# Patient Record
Sex: Female | Born: 1960 | Race: Black or African American | Hispanic: No | Marital: Married | Smoking: Never smoker
Health system: Southern US, Community
[De-identification: ages and names within clinical notes are randomized; demographics above are authoritative.]

## PROBLEM LIST (undated history)

## (undated) DIAGNOSIS — J45909 Unspecified asthma, uncomplicated: Secondary | ICD-10-CM

## (undated) DIAGNOSIS — J309 Allergic rhinitis, unspecified: Secondary | ICD-10-CM

## (undated) DIAGNOSIS — G43909 Migraine, unspecified, not intractable, without status migrainosus: Secondary | ICD-10-CM

## (undated) HISTORY — DX: Unspecified asthma, uncomplicated: J45.909

## (undated) HISTORY — DX: Allergic rhinitis, unspecified: J30.9

---

## 1997-10-17 ENCOUNTER — Encounter: Admission: RE | Admit: 1997-10-17 | Discharge: 1997-10-17 | Payer: Self-pay | Admitting: *Deleted

## 2010-03-23 HISTORY — PX: SHOULDER ARTHROSCOPY W/ ROTATOR CUFF REPAIR: SHX2400

## 2010-09-23 ENCOUNTER — Ambulatory Visit
Admission: RE | Admit: 2010-09-23 | Discharge: 2010-09-23 | Disposition: A | Discharge status: Home / Self Care | Payer: BC Managed Care – PPO | Source: Ambulatory Visit | Attending: Orthopedic Surgery | Admitting: Orthopedic Surgery

## 2010-09-23 ENCOUNTER — Other Ambulatory Visit: Payer: Self-pay | Admitting: Orthopedic Surgery

## 2010-09-23 ENCOUNTER — Encounter (HOSPITAL_BASED_OUTPATIENT_CLINIC_OR_DEPARTMENT_OTHER)
Admission: RE | Admit: 2010-09-23 | Discharge: 2010-09-23 | Disposition: A | Discharge status: Home / Self Care | Payer: BC Managed Care – PPO | Source: Ambulatory Visit | Attending: Orthopedic Surgery | Admitting: Orthopedic Surgery

## 2010-09-23 DIAGNOSIS — Z01811 Encounter for preprocedural respiratory examination: Secondary | ICD-10-CM

## 2010-09-25 ENCOUNTER — Ambulatory Visit (HOSPITAL_BASED_OUTPATIENT_CLINIC_OR_DEPARTMENT_OTHER)
Admission: RE | Admit: 2010-09-25 | Discharge: 2010-09-25 | Disposition: A | Discharge status: Home / Self Care | Payer: BC Managed Care – PPO | Source: Ambulatory Visit | Attending: Orthopedic Surgery | Admitting: Orthopedic Surgery

## 2010-09-25 DIAGNOSIS — M19019 Primary osteoarthritis, unspecified shoulder: Secondary | ICD-10-CM | POA: Insufficient documentation

## 2010-09-25 DIAGNOSIS — Z01812 Encounter for preprocedural laboratory examination: Secondary | ICD-10-CM | POA: Insufficient documentation

## 2010-09-25 DIAGNOSIS — Z01818 Encounter for other preprocedural examination: Secondary | ICD-10-CM | POA: Insufficient documentation

## 2010-09-25 DIAGNOSIS — F4541 Pain disorder exclusively related to psychological factors: Secondary | ICD-10-CM | POA: Insufficient documentation

## 2010-09-25 DIAGNOSIS — M25819 Other specified joint disorders, unspecified shoulder: Secondary | ICD-10-CM | POA: Insufficient documentation

## 2010-09-25 DIAGNOSIS — Z01810 Encounter for preprocedural cardiovascular examination: Secondary | ICD-10-CM | POA: Insufficient documentation

## 2010-10-14 NOTE — Op Note (Signed)
NAME:  Joyce Fox, Joyce Fox NO.:  [id number]  MEDICAL RECORD NO.:  [id number]  LOCATION:                                 FACILITY:  PHYSICIAN:  Jones Broom, MD         DATE OF BIRTH:  DATE OF PROCEDURE:  09/25/2010 DATE OF DISCHARGE:                              OPERATIVE REPORT   PREOPERATIVE DIAGNOSES: 1. Right shoulder impingement syndrome. 2. 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  POSTOPERATIVE DIAGNOSES: 1. Right shoulder impingement syndrome. 2. 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  PROCEDURE PERFORMED: 1. Right shoulder acromioplasty. 2. Right shoulder distal clavicle excision.  ATTENDING SURGEON:  Jones Broom, MD  ASSISTANT:  None.  ANESTHESIA:  GETA.  COMPLICATIONS:  None.  DRAINS:  None.  SPECIMENS:  None.  ESTIMATED BLOOD LOSS:  Minimal.  INDICATIONS FOR SURGERY:  The patient is a 50 year old female with long history of right shoulder pain.  She has had extensive nonoperative management for multiple injections therapy and medication and has gone on to fail conservative treatment.  She wished to go forward with arthroscopic treatment.  She had an MRI which revealed impingement and degenerative changes at the [facility] joint but no evidence of rotator cuff tearing.  When I first met her, she had a lot of neurologic symptoms as well.  This eventually resolved and she was left with shoulder pain and wanted to go ahead with surgery.  She understood risks, benefits, and alternatives to surgery including but not limited to risk of bleeding, infection, damage to neurovascular structures, risk of stiffness, and potential for incomplete pain relief.  She elected to go forward with surgery.  PROCEDURE:  The patient was identified in the preoperative holding area where I personally marked the operative site after verifying site, side, and procedure with the patient.  She had an interscalene  block given by the attending anesthesiologist and was then taken back to the operating room where general anesthesia was induced without complication.  She was placed in the beach-chair position with the opposite extremity and the head and neck carefully padded in position.  The right upper extremity was prepped and draped in a standard sterile fashion, and the patient did receive IV antibiotics prior to the procedure.  Standard posterior portal was established and the arthroscope was introduced into the joint.  An anterior portal was then established and needle localization above the subscapularis.  Diagnostic arthroscopy was then carried out, findings as described above.  The glenohumeral joint surfaces were pristine.  No loose bodies were noted.  Subscapularis and supraspinatus were intact.  The infraspinatus was intact on the articular surface. The biceps and long head biceps tendon were healthy appearing.  There was some mild fraying of the subscapularis anteriorly which was debrided.  The arthroscope was then introduced in the subacromial space and a lateral portal was established with needle localization.  She was noted to have extensive hypertrophied bursa.  This was excised using shaver from lateral portal.  The bursal surface of the rotator cuff was then carefully examined anterior and posterior.  There was no evidence for full-thickness tearing but there was some small fibrillation and partial tearing anteriorly.  This was debrided.  The remaining tendon was healthy appearing.  She was noted to have thickening of the coracoacromial ligament as well as a very large anterior acromial spur. The coracoacromial ligament was taken down with the ArthroCare and the spur was then taken down with a 4-mm bur from lateral to medial creating a perfectly flat undersurface of the anterior acromion.  The distal clavicle was then exposed arthroscopically using combination of shaver and  ArthroCare and the undersurface of the distal clavicle was taken back for about 8 mm from the lateral portal.  The anterior portal was then moved into the [facility] joint and bur was used from the anterior portal to resect the remaining portion of the distal clavicle for total resection of about 8 mm and a smooth even resection.  The excision was viewed from lateral and anterior portals and was found to be smooth and even.  The superior and posterior ligaments were kept intact.  Collene Mares was then run into the joint to remove any excess bone dust and the arthroscope was then removed from the joint.  Portals were closed with 3- 0 nylon in interrupted fashion, and sterile dressings were applied including 4x4s, ABDs, and tape.  The patient was allowed to awaken from anesthesia, transferred to stretcher, and taken to the recovery room in stable condition.  POSTOPERATIVE PLAN:  She will be discharged home today with her family. She will have oxycodone for pain control.     Jones Broom, MD     JC/MEDQ  D:  09/25/2010  T:  09/26/2010  Job:  [id number]  Electronically Signed by Jones Broom  on 10/14/2010 03:57:12 PM

## 2013-03-09 DIAGNOSIS — J45909 Unspecified asthma, uncomplicated: Secondary | ICD-10-CM | POA: Insufficient documentation

## 2013-03-09 DIAGNOSIS — R112 Nausea with vomiting, unspecified: Secondary | ICD-10-CM | POA: Insufficient documentation

## 2013-03-09 DIAGNOSIS — M5116 Intervertebral disc disorders with radiculopathy, lumbar region: Secondary | ICD-10-CM | POA: Insufficient documentation

## 2013-03-09 DIAGNOSIS — K219 Gastro-esophageal reflux disease without esophagitis: Secondary | ICD-10-CM | POA: Insufficient documentation

## 2013-03-09 DIAGNOSIS — I208 Other forms of angina pectoris: Secondary | ICD-10-CM | POA: Insufficient documentation

## 2013-03-09 DIAGNOSIS — G8929 Other chronic pain: Secondary | ICD-10-CM | POA: Insufficient documentation

## 2013-03-09 DIAGNOSIS — D649 Anemia, unspecified: Secondary | ICD-10-CM | POA: Insufficient documentation

## 2015-12-16 ENCOUNTER — Encounter: Payer: Self-pay | Admitting: Pediatrics

## 2015-12-16 ENCOUNTER — Ambulatory Visit (INDEPENDENT_AMBULATORY_CARE_PROVIDER_SITE_OTHER): Payer: Medicare HMO | Admitting: Pediatrics

## 2015-12-16 VITALS — BP 126/88 | HR 100 | Temp 98.3°F | Resp 20 | Ht 67.8 in | Wt 207.0 lb

## 2015-12-16 DIAGNOSIS — J4541 Moderate persistent asthma with (acute) exacerbation: Secondary | ICD-10-CM

## 2015-12-16 DIAGNOSIS — L503 Dermatographic urticaria: Secondary | ICD-10-CM | POA: Diagnosis not present

## 2015-12-16 DIAGNOSIS — J309 Allergic rhinitis, unspecified: Secondary | ICD-10-CM | POA: Diagnosis not present

## 2015-12-16 DIAGNOSIS — I208 Other forms of angina pectoris: Secondary | ICD-10-CM | POA: Diagnosis not present

## 2015-12-16 DIAGNOSIS — T63441A Toxic effect of venom of bees, accidental (unintentional), initial encounter: Secondary | ICD-10-CM | POA: Insufficient documentation

## 2015-12-16 DIAGNOSIS — T7800XD Anaphylactic reaction due to unspecified food, subsequent encounter: Secondary | ICD-10-CM | POA: Diagnosis not present

## 2015-12-16 DIAGNOSIS — J301 Allergic rhinitis due to pollen: Secondary | ICD-10-CM

## 2015-12-16 DIAGNOSIS — J3089 Other allergic rhinitis: Secondary | ICD-10-CM | POA: Insufficient documentation

## 2015-12-16 DIAGNOSIS — T7800XA Anaphylactic reaction due to unspecified food, initial encounter: Secondary | ICD-10-CM | POA: Insufficient documentation

## 2015-12-16 MED ORDER — CETIRIZINE HCL 10 MG PO TABS: 10.0000 mg | ORAL_TABLET | Freq: Every day | ORAL | 3 refills | Status: DC | PRN

## 2015-12-16 MED ORDER — EPINEPHRINE 0.3 MG/0.3ML IJ SOAJ: INTRAMUSCULAR | 2 refills | Status: DC

## 2015-12-16 MED ORDER — BUDESONIDE 180 MCG/ACT IN AEPB: INHALATION_SPRAY | RESPIRATORY_TRACT | 2 refills | Status: DC

## 2015-12-16 MED ORDER — FLUTICASONE PROPIONATE 50 MCG/ACT NA SUSP: NASAL | 3 refills | Status: AC

## 2015-12-16 NOTE — Progress Notes (Signed)
[address] [place] [address] Dept: [phone]  New Patient Note  Patient ID: Joyce PokeBetty Ann Fox, female    DOB: 1960/06/16  Age: 55 y.o. MRN: [id number] Date of Office Visit: 12/16/2015 Referring provider: No referring provider defined for this encounter.    Chief Complaint: Asthma; Wheezing (with exercise); and Cough  HPI Joyce Fox presents for evaluation of asthma, allergic rhinitis and food allergies. She has had asthma since childhood. She has been having coughing spells. The last time she received prednisone for asthma was in November 2016. She also has had nasal congestion for several years. She has aggravation of her symptoms on exposure to dust, cigarette smoke, ragweed, cat and  dog. She has had hives in the past from cats.. She has had hives from chocolate, pineapple, tomato, okra and aspirin and related compounds. She also has had hives from acetaminophen. At times her skin itches. She has never had eczema. A few years ago following an insect sting , she had very large local swelling and some shortness of breath.  Review of Systems  Constitutional: Negative.   HENT:       Nasal congestion for several years  Eyes: Negative.   Respiratory:       Asthma since childhood. Her last dose of prednisone was in November 2016  Cardiovascular:       Angina at times  Gastrointestinal:       Gastroesophageal reflux  Genitourinary:       Cryo therapy of her cervix at age 55. 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 Repair of the right rotator cuff but she cannot lift her arm well  Skin: Negative.   Neurological:       History of migraine headaches  Endo/Heme/Allergies:       No diabetes or thyroid disease  Psychiatric/Behavioral: Negative.     Outpatient Encounter Prescriptions as of 12/16/2015  Medication Sig  . albuterol (PROVENTIL HFA;VENTOLIN HFA) 108 (90 Base) MCG/ACT inhaler Inhale into the lungs.  Marland Kitchen. albuterol  (PROVENTIL) (2.5 MG/3ML) 0.083% nebulizer solution Take 2.5 mg by nebulization every 4 (four) hours as needed for wheezing or shortness of breath.  . benzonatate (TESSALON) 100 MG capsule Take 100 mg by mouth.  . budesonide (PULMICORT FLEXHALER) 180 MCG/ACT inhaler TWO PUFFS TWICE A DAY TO PREVENT COUGH OR WHEEZE. RINSE, GARGLE AND SPIT AFTER USE.  . cetirizine (ZYRTEC) 10 MG tablet Take 1 tablet (10 mg total) by mouth daily as needed for allergies or rhinitis.  . Cholecalciferol (VITAMIN D-1000 MAX ST) 1000 units tablet Take by mouth.  . cyclobenzaprine (FLEXERIL) 10 MG tablet Take 10 mg by mouth.  . diphenhydrAMINE (BENADRYL) 25 mg capsule Take 25 mg by mouth.  . docusate sodium (STOOL SOFTENER) 100 MG capsule Take 300 mg by mouth.  . EFFERVESCENT PAIN RELIEF [phone] MG TBEF tablet   . famotidine (PEPCID) 20 MG tablet Take 20 mg by mouth.  . ferrous sulfate 325 (65 FE) MG EC tablet Take 325 mg by mouth 3 (three) times daily with meals.  . hydrOXYzine (ATARAX/VISTARIL) 25 MG tablet Take 50 mg by mouth.  Marland Kitchen. LYRICA 75 MG capsule   . montelukast (SINGULAIR) 10 MG tablet Take 10 mg by mouth.  . nitroGLYCERIN (NITROSTAT) 0.4 MG SL tablet Place 0.4 mg under the tongue.  . pantoprazole (PROTONIX) 40 MG tablet Take 40 mg by mouth.  . traMADol (ULTRAM) 50 MG tablet Take 50 mg by mouth.  . [  DISCONTINUED] budesonide (PULMICORT FLEXHALER) 180 MCG/ACT inhaler Inhale 2 puffs into the lungs 2 (two) times daily.   . [DISCONTINUED] cetirizine (ZYRTEC) 10 MG tablet Take 10 mg by mouth.  . EPINEPHrine (EPIPEN 2-PAK) 0.3 mg/0.3 mL IJ SOAJ injection USE AS DIRECTED FOR SEVERE ALLERGIC REACTION.  . fluticasone (FLONASE) 50 MCG/ACT nasal spray TWO SPRAYS EACH NOSTRIL ONCE A DAY FOR NASAL CONGESTION OR DRAINAGE.  . [DISCONTINUED] cyclobenzaprine (FLEXERIL) 10 MG tablet   . [DISCONTINUED] famotidine (PEPCID) 20 MG tablet   . [DISCONTINUED] hydrOXYzine (ATARAX/VISTARIL) 25 MG tablet   . [DISCONTINUED]  montelukast (SINGULAIR) 10 MG tablet   . [DISCONTINUED] nitroGLYCERIN (NITROSTAT) 0.4 MG SL tablet   . [DISCONTINUED] pantoprazole (PROTONIX) 40 MG tablet   . [DISCONTINUED] RaNITidine HCl (RANITIDINE ACID REDUCER PO)   . [DISCONTINUED] traMADol (ULTRAM) 50 MG tablet    No facility-administered encounter medications on file as of 12/16/2015.      Drug Allergies:  Allergies  Allergen Reactions  . Acetaminophen Hives  . Aspirin Other (See Comments)    Per allergy test  . Chocolate Flavor Other (See Comments)    Rash in mouth  . Other Other (See Comments)    pripoxyphone  . Pineapple Other (See Comments)    Rash in mouth  . Prunus Persica Other (See Comments)    Rash in mouth  . Sulfamethoxazole Hives  . Tomato Other (See Comments)    Rash in mouth  . Bee Venom Rash and Hives  . Latex Rash and Hives  . Okra Rash  . Penicillins Rash    Family History: Josetta's family history includes Allergic rhinitis in her father and mother; Asthma in her father and mother; Heart attack in her brother and father... There is no family history of eczema, hives, food allergies, chronic bronchitis or emphysema  Social and environmental. There are no pets in the home. She is not exposed to cigarette smoking. She has never smoked cigarettes. She is disabled because of a damaged right rotator cuff  Physical Exam: BP 126/88 (BP Location: Left Arm, Patient Position: Sitting, Cuff Size: Large)   Pulse 100   Temp 98.3 F (36.8 C) (Oral)   Resp 20   Ht 5' 7.8" (1.722 m)   Wt 207 lb (93.9 kg)   SpO2 98%   BMI 31.66 kg/m    Physical Exam  Constitutional: She is oriented to person, place, and time. She appears well-developed and well-nourished.  HENT:  Eyes normal. Ears normal. Nose mild swelling of nasal turbinates. Pharynx normal.  Neck: Neck supple. No thyromegaly present.  Cardiovascular:  S1 and S2 normal no murmurs  Pulmonary/Chest:  Clear to percussion and auscultation  Abdominal: Soft.  There is no tenderness (no hepatosplenomegaly).  Lymphadenopathy:    She has no cervical adenopathy.  Neurological: She is alert and oriented to person, place, and time.  Skin:  Clear but she had dermographia noted  Psychiatric: She has a normal mood and affect. Her behavior is normal. Judgment and thought content normal.  Vitals reviewed.   Diagnostics: FVC 2.36 L FEV1 2.17 L. Predicted FVC 3.21 L predicted FEV1 2.54 L. After albuterol 2 puffs FVC 2.44 L FEV1 2.25 L-the spirometry shows a mild reduction in the forced vital capacity and there was no significant improvement after albuterol  Allergy skin tests were positive to grass pollen, weeds, tree pollens, dust mite, cat   Assessment  Assessment and Plan: 1. Anaphylactic reaction to bee sting, accidental or unintentional, initial encounter   2.  Asthma without status asthmaticus, moderate persistent, with acute exacerbation   3. Allergic rhinitis, unspecified allergic rhinitis type   4. Dermographia   5. Angina at rest [facility])   6. Allergy with anaphylaxis due to food, subsequent encounter   7. Allergic rhinitis due to pollen   8. Seasonal allergic rhinitis due to pollen     Meds ordered this encounter  Medications  . fluticasone (FLONASE) 50 MCG/ACT nasal spray    Sig: TWO SPRAYS EACH NOSTRIL ONCE A DAY FOR NASAL CONGESTION OR DRAINAGE.    Dispense:  48 g    Refill:  3  . budesonide (PULMICORT FLEXHALER) 180 MCG/ACT inhaler    Sig: TWO PUFFS TWICE A DAY TO PREVENT COUGH OR WHEEZE. RINSE, GARGLE AND SPIT AFTER USE.    Dispense:  3 each    Refill:  2  . EPINEPHrine (EPIPEN 2-PAK) 0.3 mg/0.3 mL IJ SOAJ injection    Sig: USE AS DIRECTED FOR SEVERE ALLERGIC REACTION.    Dispense:  3 Device    Refill:  2    DISPENSE MYLAN BRAND OR MYLAN GENERIC ONLY. 90  DAYS SUPPLY.  . cetirizine (ZYRTEC) 10 MG tablet    Sig: Take 1 tablet (10 mg total) by mouth daily as needed for allergies or rhinitis.    Dispense:  90 tablet    Refill:  3     Patient Instructions  Environmental control of dust mite Zyrtec 10 mg-one tablet once a day for runny nose Fluticasone 2 sprays per nostril once a day for stuffy nose Pulmicort 180 -2 puffs twice a day to prevent coughing or wheezing Proventil 2 puffs every 4 hours if needed for wheezing or coughing spells Albuterol 0.083% one unit dose every 4 hours if needed for coughing or wheezing Montelukast  10 mg once a day for coughing or wheezing Add prednisone 10 mg twice a day for 4 days, 10 mg on the fifth day Call me if you're not doing better on this treatment plan Do foods with salicylates make you itch? Since you  can eat boiled eggs, I feel that you can have a flu vaccination  Continue avoiding aspirin and related compounds, pineapple, tomato and okra. If you have an allergic reaction, including from an insect sting, take Benadryl 50 mg every 4 hours and if you have life-threatening symptoms inject  with EpiPen 0.3 mg  I will call you with the results of your  of blood test for allergy to insect stings   Return in about 6 weeks (around 01/27/2016).   Thank you for the opportunity to care for this patient.  Please do not hesitate to contact me with questions.  Tonette Bihari, M.D.  [facility] [address] [place], [place] [address] [phone]

## 2015-12-16 NOTE — Patient Instructions (Addendum)
Environmental control of dust mite Zyrtec 10 mg-one tablet once a day for runny nose Fluticasone 2 sprays per nostril once a day for stuffy nose Pulmicort 180 -2 puffs twice a day to prevent coughing or wheezing Proventil 2 puffs every 4 hours if needed for wheezing or coughing spells Albuterol 0.083% one unit dose every 4 hours if needed for coughing or wheezing Montelukast  10 mg once a day for coughing or wheezing Add prednisone 10 mg twice a day for 4 days, 10 mg on the fifth day Call me if you're not doing better on this treatment plan Do foods with salicylates make you itch? Since you  can eat boiled eggs, I feel that you can have a flu vaccination  Continue avoiding aspirin and related compounds, pineapple, tomato and okra. If you have an allergic reaction, including from an insect sting, take Benadryl 50 mg every 4 hours and if you have life-threatening symptoms inject  with EpiPen 0.3 mg  I will call you with the results of your  of blood test for allergy to insect stings

## 2015-12-27 ENCOUNTER — Telehealth: Payer: Self-pay | Admitting: Pediatrics

## 2015-12-27 NOTE — Telephone Encounter (Signed)
Patient stated that she needs a letter to give to the electric company from Dr.Bardelas saying that she has asthma and requires air conditioning. Please call her back soon as possible.

## 2015-12-31 NOTE — Telephone Encounter (Signed)
Please type a note that  Joyce Fox has severe asthma and needs air conditioning in the home

## 2015-12-31 NOTE — Telephone Encounter (Signed)
Left message that letter was ready. 

## 2016-01-27 ENCOUNTER — Encounter: Payer: Self-pay | Admitting: Pediatrics

## 2016-01-27 ENCOUNTER — Ambulatory Visit (INDEPENDENT_AMBULATORY_CARE_PROVIDER_SITE_OTHER): Payer: Medicare HMO | Admitting: Pediatrics

## 2016-01-27 VITALS — BP 136/88 | HR 98 | Temp 98.1°F | Resp 20

## 2016-01-27 DIAGNOSIS — J3089 Other allergic rhinitis: Secondary | ICD-10-CM | POA: Diagnosis not present

## 2016-01-27 DIAGNOSIS — I208 Other forms of angina pectoris: Secondary | ICD-10-CM

## 2016-01-27 DIAGNOSIS — J4540 Moderate persistent asthma, uncomplicated: Secondary | ICD-10-CM | POA: Diagnosis not present

## 2016-01-27 DIAGNOSIS — T63441A Toxic effect of venom of bees, accidental (unintentional), initial encounter: Secondary | ICD-10-CM | POA: Insufficient documentation

## 2016-01-27 DIAGNOSIS — T63441D Toxic effect of venom of bees, accidental (unintentional), subsequent encounter: Secondary | ICD-10-CM | POA: Diagnosis not present

## 2016-01-27 DIAGNOSIS — T7800XD Anaphylactic reaction due to unspecified food, subsequent encounter: Secondary | ICD-10-CM | POA: Diagnosis not present

## 2016-01-27 LAB — PULMONARY FUNCTION TEST

## 2016-01-27 MED ORDER — CETIRIZINE HCL 10 MG PO TABS: 10.0000 mg | ORAL_TABLET | Freq: Every day | ORAL | 3 refills | Status: AC | PRN

## 2016-01-27 MED ORDER — BUDESONIDE 180 MCG/ACT IN AEPB: INHALATION_SPRAY | RESPIRATORY_TRACT | 2 refills | Status: DC

## 2016-01-27 NOTE — Progress Notes (Signed)
[address] [place] [address] Dept: [phone]  FOLLOW UP NOTE  Patient ID: Joyce Fox, female    DOB: February 05, 1961  Age: 55 y.o. MRN: [id number] Date of Office Visit: 01/27/2016  Assessment  Chief Complaint: Asthma and Allergies  HPI Joyce PokeBetty Ann Fox presents for follow-up of asthma and allergic rhinitis. Her asthma has been well controlled .Marland Kitchen. Her nasal symptoms have been well controlled. She did not have her blood work for insect allergy because it would have cost her $45 and she will be going  to her clinic where it would not cost her a co-pay. She had mild delayed itching for a Fox days at the site of 3 intradermal skin tests  Current medications are Zyrtec 10 mg once a day if needed for runny nose, fluticasone 2 sprays per nostril once a day if needed for stuffy nose, Pulmicort 180--2 puffs twice a day to prevent coughing or wheezing, Proventil 2 puffs every 4 hours if needed for wheezing or coughing spells or instead albuterol 0.083% one unit dose every 4 hours if needed, montelukast  10 mg once, Benadryl and EpiPen if needed Her other medications are outlined in the chart     Drug Allergies:  Allergies  Allergen Reactions  . Acetaminophen Hives  . Aspirin Other (See Comments)    Per allergy test  . Chocolate Flavor Other (See Comments)    Rash in mouth  . Other Hives    Pain medication, pripoxyphone  . Pineapple Other (See Comments)    Rash in mouth  . Prunus Persica Other (See Comments)    Rash in mouth  . Sulfamethoxazole Hives  . Tomato Other (See Comments)    Rash in mouth  . Bee Venom Rash and Hives  . Latex Rash and Hives  . Okra Rash  . Penicillins Rash    Physical Exam: BP 136/88   Pulse 98   Temp 98.1 F (36.7 C) (Oral)   Resp 20   SpO2 95%    Physical Exam  Constitutional: She is oriented to person, place, and time. She appears well-developed and well-nourished.  HENT:  Eyes normal. Ears normal. Nose normal. Pharynx normal.    Neck: Neck supple.  Cardiovascular:  S1 and S2 normal no murmurs  Pulmonary/Chest:  Clear to percussion auscultation  Lymphadenopathy:    She has no cervical adenopathy.  Neurological: She is alert and oriented to person, place, and time.  Psychiatric: She has a normal mood and affect. Her behavior is normal. Judgment and thought content normal.  Vitals reviewed.   Diagnostics:  FVC 2.47 L FEV1 2.15 L. Predicted FVC 3.07 L predicted FEV1 2.43 L-the spirometry is in the normal range  Assessment and Plan: 1. Poisoning by bee sting, accidental or unintentional, subsequent encounter   2. Moderate persistent asthma without complication   3. Other allergic rhinitis   4. Anaphylactic shock due to food, subsequent encounter   5. Angina at rest [facility])     Meds ordered this encounter  Medications  . cetirizine (ZYRTEC) 10 MG tablet    Sig: Take 1 tablet (10 mg total) by mouth daily as needed for allergies or rhinitis.    Dispense:  90 tablet    Refill:  3  . budesonide (PULMICORT FLEXHALER) 180 MCG/ACT inhaler    Sig: TWO PUFFS TWICE A DAY TO PREVENT COUGH OR WHEEZE. RINSE, GARGLE AND SPIT AFTER USE.    Dispense:  3 each    Refill:  2  Patient Instructions  Continue on her current medications We will call you with the results of your blood work for insect allergy Continue avoiding aspirin and related compounds, pineapple, tomato and okra. If you have an allergic reaction , including from an insect sting, take Benadryl 50 mg every 4 hours and if you have  life-threatening symptoms inject  with EpiPen 0.3 mg.   Return in about 1 year (around 01/26/2017).    Thank you for the opportunity to care for this patient.  Please do not hesitate to contact me with questions.  Tonette BihariJ. A. Bardelas, M.D.  [facility] [address] [place], [place] [address] [phone]

## 2016-01-27 NOTE — Patient Instructions (Signed)
Continue on her current medications We will call you with the results of your blood work for insect allergy Continue avoiding aspirin and related compounds, pineapple, tomato and okra. If you have an allergic reaction , including from an insect sting, take Benadryl 50 mg every 4 hours and if you have  life-threatening symptoms inject  with EpiPen 0.3 mg.

## 2016-01-28 ENCOUNTER — Encounter: Payer: Self-pay | Admitting: Pediatrics

## 2018-07-29 ENCOUNTER — Telehealth: Payer: Self-pay | Admitting: Allergy

## 2018-07-29 ENCOUNTER — Other Ambulatory Visit: Payer: Self-pay | Admitting: Allergy

## 2018-07-29 MED ORDER — PREDNISONE 20 MG PO TABS: 20.0000 mg | ORAL_TABLET | Freq: Two times a day (BID) | ORAL | 0 refills | Status: AC

## 2018-07-29 NOTE — Telephone Encounter (Signed)
See orders only note from 07/29/18

## 2018-07-29 NOTE — Progress Notes (Signed)
Called by patient and she states she has been having increase in coughing and mucus production of needing to use her albuterol every 2 hours since Tuesday of this week.  She states she rolled in her car with the windows down on Monday and feels that may have been her trigger.  She states she is taking Pulmicort 2 puffs twice a day and Singulair daily and Zyrtec daily.  She states she has been getting her refills from her primary care's office.  She has not been seen in our office since November 2017 by Joyce Fox.  Advised that she likely was having an asthma flare at this point and will send in prednisone 20 mg twice a day x5 days and advised that she continue her regular medications. Advised that our office will call her on Monday to get an update and to have her schedule a follow-up visit with Joyce Fox.

## 2018-08-01 NOTE — Telephone Encounter (Signed)
Pts cough is doing a little better, telemed phone visit scheduled tomorrow with Dr. Beaulah Dinning.

## 2018-08-02 ENCOUNTER — Encounter: Payer: Self-pay | Admitting: Pediatrics

## 2018-08-02 ENCOUNTER — Ambulatory Visit (INDEPENDENT_AMBULATORY_CARE_PROVIDER_SITE_OTHER): Payer: Medicare HMO | Admitting: Pediatrics

## 2018-08-02 ENCOUNTER — Other Ambulatory Visit: Payer: Self-pay

## 2018-08-02 DIAGNOSIS — T7800XD Anaphylactic reaction due to unspecified food, subsequent encounter: Secondary | ICD-10-CM

## 2018-08-02 DIAGNOSIS — J3089 Other allergic rhinitis: Secondary | ICD-10-CM | POA: Diagnosis not present

## 2018-08-02 DIAGNOSIS — T63481D Toxic effect of venom of other arthropod, accidental (unintentional), subsequent encounter: Secondary | ICD-10-CM

## 2018-08-02 DIAGNOSIS — J4540 Moderate persistent asthma, uncomplicated: Secondary | ICD-10-CM | POA: Diagnosis not present

## 2018-08-02 DIAGNOSIS — T63481A Toxic effect of venom of other arthropod, accidental (unintentional), initial encounter: Secondary | ICD-10-CM | POA: Insufficient documentation

## 2018-08-02 MED ORDER — BENZONATATE 100 MG PO CAPS: 100.0000 mg | ORAL_CAPSULE | Freq: Four times a day (QID) | ORAL | 3 refills | Status: DC | PRN

## 2018-08-02 MED ORDER — PREDNISONE 10 MG PO TABS: ORAL_TABLET | ORAL | 0 refills | Status: DC

## 2018-08-02 MED ORDER — MONTELUKAST SODIUM 10 MG PO TABS: 10.0000 mg | ORAL_TABLET | Freq: Every day | ORAL | 3 refills | Status: AC

## 2018-08-02 MED ORDER — EPINEPHRINE 0.3 MG/0.3ML IJ SOAJ: INTRAMUSCULAR | 2 refills | Status: DC

## 2018-08-02 MED ORDER — FLUTICASONE PROPIONATE HFA 110 MCG/ACT IN AERO: 1.0000 | INHALATION_SPRAY | Freq: Two times a day (BID) | RESPIRATORY_TRACT | 3 refills | Status: DC

## 2018-08-02 NOTE — Progress Notes (Signed)
RE: Joyce Fox MRN: [id number] DOB: 1960/04/10 Date of Telemedicine Visit: 08/02/2018  Referring provider: No ref. provider found Primary care provider: Ardyth Gal, MD  Chief Complaint: Cough   Telemedicine Follow Up Visit via Telephone: I connected with [name] for a follow up on 08/02/18 by telephone and verified that I am speaking with the correct person using two identifiers.   I discussed the limitations, risks, security and privacy concerns of performing an evaluation and management service by telephone and the availability of in person appointments. I also discussed with the patient that there may be a patient responsible charge related to this service. The patient expressed understanding and agreed to proceed.  Patient is at home accompanied by herself who provided/contributed to the history.  Provider is at the office.  Visit start time: 2:32 Visit end time: 3:00 Insurance consent/check in by: [facility] consent and medical assistant/nurse: Logan  History of Present Illness: She is a 58 y.o. female, who is being followed for asthma and allergic rhinitis. Her previous allergy office visit was on 01/27/2016 with Dr. Beaulah Dinning.  We had not seen [name] since November 2017.  Her asthma was well controlled according to her until a week ago when she developed coughing and wheezing after riding in a car with the windows down.  She tried to fill her Pulmicort and it was $695 so she could not afford it.  She did fill her montelukast 10 mg once a day.  She would like to have a different inhaled steroid.  Other inhaled steroids have given her a yeast infection.. She continues to avoid aspirin and related compounds, pineapple, tomato and okra.  She has not had insect stings.She  needs EpiPen refilled.  She was given 20 mg twice a day of prednisone starting 5 days ago.  Today was her last day.  She still having some chest congestion and would like to take it for a few more  days    Assessment and Plan:  Flovent 110-1 puff twice a day to prevent coughing or wheezing,  instead of Pulmicort Continue montelukast 10 mg once a day to prevent coughing or wheezing Proventil 2 puffs every 4 hours if needed for wheezing or coughing spells or instead albuterol 0.083% 1 unit dose every 4 hours if needed Prednisone 10 mg twice a daay for 4 days, 10 mg on the fifth day Benzonatate 100 mg - 1 tablet every 6 hours if needed for cough Call us if you are not doing well on this treatment plan Cetirizine 10 mg once a day if needed for runny nose Fluticasone 2 sprays per nostril once a day if needed for stuffy nose  Continue avoiding aspirin and related compounds, pineapple, tomato and okra.  If you have an allergic reaction including from an insect sting, take Benadryl 50 mg every 4 hours and if you have  life-threatening symptoms inject with EpiPen 0.3 mg  Return in about 6 weeks (around 09/13/2018).  Meds ordered this encounter  Medications   fluticasone (FLOVENT HFA) 110 MCG/ACT inhaler    Sig: Inhale 1 puff into the lungs 2 (two) times daily.    Dispense:  3 Inhaler    Refill:  3   predniSONE (DELTASONE) 10 MG tablet    Sig: Take 1 tablet twice daily for 4 days, then 1 tablet on day 5, then stop.    Dispense:  9 tablet    Refill:  0   benzonatate (TESSALON) 100 MG capsule  Sig: Take 1 capsule (100 mg total) by mouth every 6 (six) hours as needed for cough.    Dispense:  20 capsule    Refill:  3   EPINEPHrine (EPIPEN 2-PAK) 0.3 mg/0.3 mL IJ SOAJ injection    Sig: USE AS DIRECTED FOR SEVERE ALLERGIC REACTION.    Dispense:  2 Device    Refill:  2    DISPENSE MYLAN BRAND OR MYLAN GENERIC ONLY. 90  DAYS SUPPLY.   montelukast (SINGULAIR) 10 MG tablet    Sig: Take 1 tablet (10 mg total) by mouth at bedtime.    Dispense:  90 tablet    Refill:  3   Lab Orders  No laboratory test(s) ordered today    Diagnostics: None.  Medication List:  Current Outpatient  Medications  Medication Sig Dispense Refill   albuterol (PROVENTIL HFA;VENTOLIN HFA) 108 (90 Base) MCG/ACT inhaler Inhale into the lungs.     albuterol (PROVENTIL) (2.5 MG/3ML) 0.083% nebulizer solution Take 2.5 mg by nebulization every 4 (four) hours as needed for wheezing or shortness of breath.     benzonatate (TESSALON) 100 MG capsule Take 1 capsule (100 mg total) by mouth every 6 (six) hours as needed for cough. 20 capsule 3   budesonide (PULMICORT FLEXHALER) 180 MCG/ACT inhaler TWO PUFFS TWICE A DAY TO PREVENT COUGH OR WHEEZE. RINSE, GARGLE AND SPIT AFTER USE. 3 each 2   cetirizine (ZYRTEC) 10 MG tablet Take 1 tablet (10 mg total) by mouth daily as needed for allergies or rhinitis. 90 tablet 3   Cholecalciferol (VITAMIN D-1000 MAX ST) 1000 units tablet Take by mouth.     diphenhydrAMINE (BENADRYL) 25 mg capsule Take 25 mg by mouth.     docusate sodium (STOOL SOFTENER) 100 MG capsule Take 300 mg by mouth.     EPINEPHrine (EPIPEN 2-PAK) 0.3 mg/0.3 mL IJ SOAJ injection USE AS DIRECTED FOR SEVERE ALLERGIC REACTION. 2 Device 2   famotidine (PEPCID) 20 MG tablet Take 20 mg by mouth.     ferrous sulfate 325 (65 FE) MG EC tablet Take 325 mg by mouth 3 (three) times daily with meals.     fluticasone (FLONASE) 50 MCG/ACT nasal spray TWO SPRAYS EACH NOSTRIL ONCE A DAY FOR NASAL CONGESTION OR DRAINAGE. 48 g 3   hydrOXYzine (ATARAX/VISTARIL) 25 MG tablet Take 50 mg by mouth.     montelukast (SINGULAIR) 10 MG tablet Take 1 tablet (10 mg total) by mouth at bedtime. 90 tablet 3   nitroGLYCERIN (NITROSTAT) 0.4 MG SL tablet Place 0.4 mg under the tongue.     pantoprazole (PROTONIX) 40 MG tablet Take 40 mg by mouth.     predniSONE (DELTASONE) 20 MG tablet Take 1 tablet (20 mg total) by mouth 2 (two) times daily with a meal for 5 days. 10 tablet 0   pregabalin (LYRICA) 75 MG capsule Take by mouth.     fluticasone (FLOVENT HFA) 110 MCG/ACT inhaler Inhale 1 puff into the lungs 2 (two) times  daily. 3 Inhaler 3   predniSONE (DELTASONE) 10 MG tablet Take 1 tablet twice daily for 4 days, then 1 tablet on day 5, then stop. 9 tablet 0   No current facility-administered medications for this visit.    Allergies: Allergies  Allergen Reactions   Acetaminophen Hives   Aspirin Other (See Comments)    Per allergy test   Chocolate Flavor Other (See Comments)    Rash in mouth   Other Hives    Pain medication, pripoxyphone  Pineapple Other (See Comments)    Rash in mouth   Prunus Persica Other (See Comments)    Rash in mouth   Sulfamethoxazole Hives   Tomato Other (See Comments)    Rash in mouth   Bee Venom Rash and Hives   Latex Rash and Hives   Okra Rash   Penicillins Rash   I reviewed her past medical history, social history, family history, and environmental history and no significant changes have been reported from previous visit on January 27, 2016  Review of Systems Objective: Physical Exam Not obtained as encounter was done via telephone.   Previous notes and tests were reviewed.  I discussed the assessment and treatment plan with the patient. The patient was provided an opportunity to ask questions and all were answered. The patient agreed with the plan and demonstrated an understanding of the instructions.   The patient was advised to call back or seek an in-person evaluation if the symptoms worsen or if the condition fails to improve as anticipated.  I provided 28  minutes of non-face-to-face time during this encounter.  It was my pleasure to participate in Canaseraga Hengst's care today. Please feel free to contact me with any questions or concerns.   Sincerely,  Ginnie Smart, MD

## 2018-08-02 NOTE — Patient Instructions (Addendum)
Flovent 110-1 puff twice a day to prevent coughing or wheezing,  instead of Pulmicort Continue montelukast 10 mg once a day to prevent coughing or wheezing Proventil 2 puffs every 4 hours if needed for wheezing or coughing spells or instead albuterol 0.083% 1 unit dose every 4 hours if needed Prednisone 10 mg twice a daay for 4 days, 10 mg on the fifth day Benzonatate 100 mg - 1 tablet every 6 hours if needed for cough Call us if you are not doing well on this treatment plan Cetirizine 10 mg once a day if needed for runny nose Fluticasone 2 sprays per nostril once a day if needed for stuffy nose  Continue avoiding aspirin and related compounds, pineapple, tomato and okra.  If you have an allergic reaction including from an insect sting, take Benadryl 50 mg every 4 hours and if you have  life-threatening symptoms inject with EpiPen 0.3 mg

## 2018-09-29 ENCOUNTER — Telehealth: Payer: Self-pay | Admitting: Pediatrics

## 2018-09-29 NOTE — Telephone Encounter (Signed)
See family doctor for Strep screen Continue Benadryl . We could work her in tomorrow

## 2018-09-29 NOTE — Telephone Encounter (Signed)
Pt informed. She was scheduled for tomorrow.

## 2018-09-29 NOTE — Telephone Encounter (Signed)
PT called to report she ate [employer] fish on Thursday 7/2 10:30 pm caused tongue swelling, neck swelling, headache. Went to ED on Friday 7/3 12:30am. Was given prednisone 40 mg, finished last dose today 09/29/2018. Right side and back of neck is still swollen. Has sore throat today.   Sibel [phone]

## 2018-09-30 ENCOUNTER — Ambulatory Visit: Payer: Medicare HMO | Admitting: Allergy

## 2018-09-30 ENCOUNTER — Encounter: Payer: Self-pay | Admitting: Allergy

## 2018-09-30 ENCOUNTER — Other Ambulatory Visit: Payer: Self-pay

## 2018-09-30 VITALS — BP 140/88 | HR 90 | Temp 96.7°F | Resp 16 | Ht 68.5 in | Wt 209.0 lb

## 2018-09-30 DIAGNOSIS — J302 Other seasonal allergic rhinitis: Secondary | ICD-10-CM | POA: Diagnosis not present

## 2018-09-30 DIAGNOSIS — J4540 Moderate persistent asthma, uncomplicated: Secondary | ICD-10-CM

## 2018-09-30 DIAGNOSIS — J3089 Other allergic rhinitis: Secondary | ICD-10-CM

## 2018-09-30 DIAGNOSIS — T7800XD Anaphylactic reaction due to unspecified food, subsequent encounter: Secondary | ICD-10-CM

## 2018-09-30 MED ORDER — EPINEPHRINE 0.3 MG/0.3ML IJ SOAJ: 0.3000 mg | Freq: Once | INTRAMUSCULAR | 1 refills | Status: AC

## 2018-09-30 NOTE — Assessment & Plan Note (Addendum)
Past history - 2017 skin testing was positive to grass, weed, trees, dust mite, cat, ragweed.  May use over the counter antihistamines such as Zyrtec (cetirizine), Claritin (loratadine), Allegra (fexofenadine), or Xyzal (levocetirizine) daily as needed.  May use Flonase 2 sprays in each nostril once a day.   Continue environmental control measures.

## 2018-09-30 NOTE — Progress Notes (Signed)
Follow Up Note  RE: Joyce Fox MRN: [id number] DOB: 1960/11/04 Date of Office Visit: 09/30/2018  Referring provider: Ardyth GalYbanez, Jane, MD Primary care provider: Ardyth GalYbanez, Jane, MD  Chief Complaint: Allergic Reaction (shellfish)  History of Present Illness: I had the pleasure of seeing Joyce Fox for a follow up visit at the [facility] on 09/30/2018. She is a 58 y.o. female, who is being followed for asthma and allergic rhinitis. Today she is here for new complaint of allergic reaction. Her previous allergy office visit was on 08/02/2018 with Dr. Beaulah DinningBardelas.   Patient had an allergic reaction on 09/23/2018 which required an ER visit.  She bought a croaker fresh fish at a [employer] and ate around 10:30PM-11PM. About 20 minutes afterwards she noticed some tongue tingling and numbness. She also noticed tongue angioedema. She took 2 hydroxyzine pills at home which did not help. She also developed a headache. Denies any rash.  She also had baked beans and cole slaw with this. She had this type of fish before with no issues.  She went to the ER and was treated with benadryl, solumedrol and Zofran. Symptoms resolved within 3 hours.   Currently avoiding shellfish and seafood. Also avoiding pineapple, tomato, okra.  No previous reactions like this.  Asthma: Denies any SOB, coughing, wheezing, chest tightness, nocturnal awakenings, ER/urgent care visits or prednisone use since the last visit. Currently on Flovent 110 1 puff BID.  Allergic rhinitis: Still taking Singulair, zyrtec daily, Flonase.   Assessment and Plan: [name] is a 58 y.o. female with: Anaphylactic shock due to adverse food reaction Past history - 2017 skin testing to adult food panel was all negative. Interim history - Allergic reaction on 7/3 requiring ER visit. Symptoms resolved with benadryl and solumedrol. Trigger may have been croaker fish.  Unable to skin test today due to recent antihistamine  intake.  Start to avoid seafood and shellfish.  Continue to avoid all other foods that bother you - tomatoes, okra, pineapple as before.   Get bloodwork after next Friday which is 2 weeks after reaction.  I have prescribed epinephrine injectable and demonstrated proper use. For mild symptoms you can take over the counter antihistamines such as Benadryl and monitor symptoms closely. If symptoms worsen or if you have severe symptoms including breathing issues, throat closure, significant swelling, whole body hives, severe diarrhea and vomiting, lightheadedness then inject epinephrine and seek immediate medical care afterwards.  Food action plan given.  Moderate persistent asthma without complication Doing well with below regimen. . Daily controller medication(s): Flovent 110 1 puff twice a day with spacer and rinse mouth afterwards. o Continue Singulair 10mg  daily.  . Prior to physical activity: May use albuterol rescue inhaler 2 puffs 5 to 15 minutes prior to strenuous physical activities. Marland Kitchen. Rescue medications: May use albuterol rescue inhaler 2 puffs or nebulizer every 4 to 6 hours as needed for shortness of breath, chest tightness, coughing, and wheezing. Monitor frequency of use.  . During upper respiratory infections: Start Flovent 110 3 puffs three times a day for 1-2 weeks . Get spirometry at next visit.  Seasonal and perennial allergic rhinitis Past history - 2017 skin testing was positive to grass, weed, trees, dust mite, cat, ragweed.  May use over the counter antihistamines such as Zyrtec (cetirizine), Claritin (loratadine), Allegra (fexofenadine), or Xyzal (levocetirizine) daily as needed.  May use Flonase 2 sprays in each nostril once a day.   Continue environmental control measures.  Return in about 2 months (around 12/01/2018).  Meds ordered this encounter  Medications  . EPINEPHrine 0.3 mg/0.3 mL IJ SOAJ injection    Sig: Inject 0.3 mLs (0.3 mg total) into the muscle  once for 1 dose.    Dispense:  2 each    Refill:  1    Lab Orders     Allergen Profile, Shellfish     Allergen Profile, Food-Fish     Tryptase  Diagnostics: Unable to skin test today due to recent antihistamine intake.  Medication List:  Current Outpatient Medications  Medication Sig Dispense Refill  . albuterol (PROVENTIL HFA;VENTOLIN HFA) 108 (90 Base) MCG/ACT inhaler Inhale into the lungs.    Marland Kitchen albuterol (PROVENTIL) (2.5 MG/3ML) 0.083% nebulizer solution Take 2.5 mg by nebulization every 4 (four) hours as needed for wheezing or shortness of breath.    . benzonatate (TESSALON) 100 MG capsule Take 1 capsule (100 mg total) by mouth every 6 (six) hours as needed for cough. 20 capsule 3  . budesonide (PULMICORT FLEXHALER) 180 MCG/ACT inhaler TWO PUFFS TWICE A DAY TO PREVENT COUGH OR WHEEZE. RINSE, GARGLE AND SPIT AFTER USE. 3 each 2  . cetirizine (ZYRTEC) 10 MG tablet Take 1 tablet (10 mg total) by mouth daily as needed for allergies or rhinitis. 90 tablet 3  . Cholecalciferol (VITAMIN D-1000 MAX ST) 1000 units tablet Take by mouth.    . diphenhydrAMINE (BENADRYL) 25 mg capsule Take 25 mg by mouth.    . docusate sodium (STOOL SOFTENER) 100 MG capsule Take 300 mg by mouth.    . EPINEPHrine (EPIPEN 2-PAK) 0.3 mg/0.3 mL IJ SOAJ injection USE AS DIRECTED FOR SEVERE ALLERGIC REACTION. 2 Device 2  . famotidine (PEPCID) 20 MG tablet Take 20 mg by mouth.    . ferrous sulfate 325 (65 FE) MG EC tablet Take 325 mg by mouth 3 (three) times daily with meals.    . fluticasone (FLONASE) 50 MCG/ACT nasal spray TWO SPRAYS EACH NOSTRIL ONCE A DAY FOR NASAL CONGESTION OR DRAINAGE. 48 g 3  . fluticasone (FLOVENT HFA) 110 MCG/ACT inhaler Inhale 1 puff into the lungs 2 (two) times daily. 3 Inhaler 3  . hydrOXYzine (ATARAX/VISTARIL) 25 MG tablet Take 50 mg by mouth.    . montelukast (SINGULAIR) 10 MG tablet Take 1 tablet (10 mg total) by mouth at bedtime. 90 tablet 3  . nitroGLYCERIN (NITROSTAT) 0.4 MG SL  tablet Place 0.4 mg under the tongue.    . pantoprazole (PROTONIX) 40 MG tablet Take 40 mg by mouth.    . pregabalin (LYRICA) 75 MG capsule Take by mouth.    . EPINEPHrine 0.3 mg/0.3 mL IJ SOAJ injection Inject 0.3 mLs (0.3 mg total) into the muscle once for 1 dose. 2 each 1  . predniSONE (DELTASONE) 10 MG tablet Take 1 tablet twice daily for 4 days, then 1 tablet on day 5, then stop. (Patient not taking: Reported on 09/30/2018) 9 tablet 0   No current facility-administered medications for this visit.    Allergies: Allergies  Allergen Reactions  . Acetaminophen Hives  . Aspirin Other (See Comments)    Per allergy test  . Chocolate Flavor Other (See Comments)    Rash in mouth  . Other Hives    Pain medication, pripoxyphone  . Pineapple Other (See Comments)    Rash in mouth  . Prunus Persica Other (See Comments)    Rash in mouth  . Sulfamethoxazole Hives  . Tomato Other (See Comments)  Rash in mouth  . Bee Venom Rash and Hives  . Latex Rash and Hives  . Okra Rash  . Penicillins Rash   I reviewed her past medical history, social history, family history, and environmental history and no significant changes have been reported from previous visit on 08/02/2018.  Review of Systems  Constitutional: Negative for appetite change, chills, fever and unexpected weight change.  HENT: Positive for sore throat. Negative for congestion and rhinorrhea.   Eyes: Negative for itching.  Respiratory: Negative for cough, chest tightness, shortness of breath and wheezing.   Gastrointestinal: Negative for abdominal pain.  Skin: Negative for rash.  Allergic/Immunologic: Positive for environmental allergies and food allergies.  Neurological: Negative for headaches.   Objective: BP 140/88   Pulse 90   Temp (!) 96.7 F (35.9 C) (Temporal)   Resp 16   Ht 5' 8.5" (1.74 m)   Wt 209 lb (94.8 kg)   SpO2 98%   BMI 31.32 kg/m  Body mass index is 31.32 kg/m. Physical Exam  Constitutional: She is  oriented to person, place, and time. She appears well-developed and well-nourished.  HENT:  Head: Normocephalic and atraumatic.  Right Ear: External ear normal.  Left Ear: External ear normal.  Nose: Nose normal.  Mouth/Throat: Oropharynx is clear and moist.  Eyes: Conjunctivae and EOM are normal.  Neck: Neck supple.  Cardiovascular: Normal rate, regular rhythm and normal heart sounds. Exam reveals no gallop and no friction rub.  No murmur heard. Pulmonary/Chest: Effort normal and breath sounds normal. She has no wheezes. She has no rales.  Neurological: She is alert and oriented to person, place, and time.  Skin: Skin is warm. No rash noted.  Psychiatric: She has a normal mood and affect. Her behavior is normal.  Nursing note and vitals reviewed.  Previous notes and tests were reviewed. The plan was reviewed with the patient/family, and all questions/concerned were addressed.  It was my pleasure to see [name] today and participate in her care. Please feel free to contact me with any questions or concerns.  Sincerely,  Wyline MoodYoon Kim, DO Allergy & Immunology  [facility] [place] office: [phone] [facility] office: [phone] [place] office: [phone]

## 2018-09-30 NOTE — Assessment & Plan Note (Addendum)
Doing well with below regimen. . Daily controller medication(s): Flovent 110 1 puff twice a day with spacer and rinse mouth afterwards. o Continue Singulair 10mg  daily.  . Prior to physical activity: May use albuterol rescue inhaler 2 puffs 5 to 15 minutes prior to strenuous physical activities. Marland Kitchen Rescue medications: May use albuterol rescue inhaler 2 puffs or nebulizer every 4 to 6 hours as needed for shortness of breath, chest tightness, coughing, and wheezing. Monitor frequency of use.  . During upper respiratory infections: Start Flovent 110 3 puffs three times a day for 1-2 weeks . Get spirometry at next visit.

## 2018-09-30 NOTE — Patient Instructions (Addendum)
   Start to avoid seafood and shellfish.  Continue to avoid all other foods that bother you - tomatoes, okra, pineapple.   Get bloodwork after next Friday.  I have prescribed epinephrine injectable and demonstrated proper use. For mild symptoms you can take over the counter antihistamines such as Benadryl and monitor symptoms closely. If symptoms worsen or if you have severe symptoms including breathing issues, throat closure, significant swelling, whole body hives, severe diarrhea and vomiting, lightheadedness then inject epinephrine and seek immediate medical care afterwards.  Food action plan given.  Asthma: . Daily controller medication(s): Flovent 110 1 puff twice a day with spacer and rinse mouth afterwards. o Continue Singulair 10mg  daily.  . Prior to physical activity: May use albuterol rescue inhaler 2 puffs 5 to 15 minutes prior to strenuous physical activities. Marland Kitchen Rescue medications: May use albuterol rescue inhaler 2 puffs or nebulizer every 4 to 6 hours as needed for shortness of breath, chest tightness, coughing, and wheezing. Monitor frequency of use.  . During upper respiratory infections: Start Flovent 110 3 puffs three times a day for 1-2 weeks . Asthma control goals:  o Full participation in all desired activities (may need albuterol before activity) o Albuterol use two times or less a week on average (not counting use with activity) o Cough interfering with sleep two times or less a month o Oral steroids no more than once a year o No hospitalizations  Allergic rhinitis:  May use over the counter antihistamines such as Zyrtec (cetirizine), Claritin (loratadine), Allegra (fexofenadine), or Xyzal (levocetirizine) daily as needed.  May use Flonase 2 sprays in each nostril once a day.   Follow up in 2 months

## 2018-09-30 NOTE — Assessment & Plan Note (Addendum)
Past history - 2017 skin testing to adult food panel was all negative. Interim history - Allergic reaction on 7/3 requiring ER visit. Symptoms resolved with benadryl and solumedrol. Trigger may have been croaker fish.  Unable to skin test today due to recent antihistamine intake.  Start to avoid seafood and shellfish.  Continue to avoid all other foods that bother you - tomatoes, okra, pineapple as before.   Get bloodwork after next Friday which is 2 weeks after reaction.  I have prescribed epinephrine injectable and demonstrated proper use. For mild symptoms you can take over the counter antihistamines such as Benadryl and monitor symptoms closely. If symptoms worsen or if you have severe symptoms including breathing issues, throat closure, significant swelling, whole body hives, severe diarrhea and vomiting, lightheadedness then inject epinephrine and seek immediate medical care afterwards.  Food action plan given.

## 2018-10-18 LAB — ALLERGEN PROFILE, SHELLFISH
Clam IgE: <0.1 kU/L
F023-IgE Crab: <0.1 kU/L
F080-IgE Lobster: <0.1 kU/L
F290-IgE Oyster: <0.1 kU/L
Scallop IgE: <0.1 kU/L
Shrimp IgE: 0.13 kU/L — AB

## 2018-10-18 LAB — ALLERGEN PROFILE, FOOD-FISH
Allergen Mackerel IgE: <0.1 kU/L
Allergen Salmon IgE: <0.1 kU/L
Allergen Trout IgE: <0.1 kU/L
Allergen Walley Pike IgE: <0.1 kU/L
Codfish IgE: <0.1 kU/L
Halibut IgE: <0.1 kU/L
Tuna: <0.1 kU/L

## 2018-10-18 LAB — TRYPTASE: Tryptase: 9 ug/L (ref 2.2–13.2)

## 2018-10-28 ENCOUNTER — Ambulatory Visit: Payer: Medicare HMO | Admitting: Allergy

## 2018-10-28 ENCOUNTER — Encounter: Payer: Self-pay | Admitting: Allergy

## 2018-10-28 ENCOUNTER — Other Ambulatory Visit: Payer: Self-pay

## 2018-10-28 VITALS — BP 138/78 | HR 90 | Temp 96.3°F | Resp 18

## 2018-10-28 DIAGNOSIS — J4540 Moderate persistent asthma, uncomplicated: Secondary | ICD-10-CM | POA: Diagnosis not present

## 2018-10-28 DIAGNOSIS — J3089 Other allergic rhinitis: Secondary | ICD-10-CM | POA: Diagnosis not present

## 2018-10-28 DIAGNOSIS — T7800XD Anaphylactic reaction due to unspecified food, subsequent encounter: Secondary | ICD-10-CM

## 2018-10-28 DIAGNOSIS — J302 Other seasonal allergic rhinitis: Secondary | ICD-10-CM | POA: Diagnosis not present

## 2018-10-28 NOTE — Progress Notes (Signed)
Follow Up Note  RE: Joyce Fox MRN: [id number] DOB: 1960-04-04 Date of Office Visit: 10/28/2018  Referring provider: Ardyth GalYbanez, Jane, MD Primary care provider: Ardyth GalYbanez, Jane, MD  Chief Complaint: Allergy Testing  History of Present Illness: I had the pleasure of seeing Joyce CornfieldBetty Fox for a follow up visit at the [facility] on 10/28/2018. She is a 58 y.o. female, who is being followed for food allergy, asthma, allergic rhinitis. Today she is here for skin testing. Her previous allergy office visit was on 09/30/2018 with Dr. Selena BattenKim.   Food allergy No additional reactions.  Has been avoiding seafood and shellfish. She still has the frozen fish which gave her the reaction. She usually does not get fish from that shop.   Moderate persistent asthma without complication Doing well with below regimen. ? Flovent 110 1 puff twice a day and Singulair 10mg  daily.   Seasonal and perennial allergic rhinitis Symptoms well controlled.  Assessment and Plan: Joyce Fox is a 58 y.o. female with: Anaphylactic shock due to adverse food reaction Past history - 2017 skin testing to adult food panel was all negative. Allergic reaction on 7/3 requiring ER visit. Symptoms resolved with benadryl and solumedrol. Trigger may have been croaker fish. 2020 immunocap borderline positive to shrimp, negative to all others.  Interim history - No issues since avoidance.   Today's skin testing showed: Negative to seafood and shellfish but positive control was borderline.   Continue to avoid seafood and shellfish.  Schedule for in office food challenge to finned fish. Advised to bring in fish she would normally eat in the past.   Also bring in a small piece of the croaker fish you had a reaction to and we will skin test with that at next visit.   For mild symptoms you can take over the counter antihistamines such as Benadryl and monitor symptoms closely. If symptoms worsen or if you have severe  symptoms including breathing issues, throat closure, significant swelling, whole body hives, severe diarrhea and vomiting, lightheadedness then inject epinephrine and seek immediate medical care afterwards.  Moderate persistent asthma without complication Doing well with below regimen. . Today's spirometry was unremarkable.  . Daily controller medication(s): Flovent 110 1 puff twice a day with spacer and rinse mouth afterwards. o Continue Singulair 10mg  daily.  . Prior to physical activity: May use albuterol rescue inhaler 2 puffs 5 to 15 minutes prior to strenuous physical activities. Marland Kitchen. Rescue medications: May use albuterol rescue inhaler 2 puffs or nebulizer every 4 to 6 hours as needed for shortness of breath, chest tightness, coughing, and wheezing. Monitor frequency of use.  . During upper respiratory infections: Start Flovent 110 3 puffs three times a day for 1-2 weeks  Seasonal and perennial allergic rhinitis Past history - 2017 skin testing was positive to grass, weed, trees, dust mite, cat, ragweed. Interim history - stable.   May use over the counter antihistamines such as Zyrtec (cetirizine), Claritin (loratadine), Allegra (fexofenadine), or Xyzal (levocetirizine) daily as needed.  May use Flonase 2 sprays in each nostril once a day.   Continue environmental control measures.  Return for Food challenge.  Diagnostics: Spirometry:  Tracings reviewed. Her effort: It was hard to get consistent efforts and there is a question as to whether this reflects a maximal maneuver. FVC: 2.12L FEV1: 1.97L, 77% predicted FEV1/FVC ratio: 93% Interpretation: Spirometry consistent with possible restrictive disease.  Please see scanned spirometry results for details.  Skin Testing: Select foods. Negative test  to: seafood, shellfish, mollusks but positive control was borderline.  Results discussed with patient/family. Food Adult Perc - 10/28/18 0900    Time Antigen Placed  0900    Allergen  Manufacturer  Lavella Hammock    Location  Back    Number of allergen test  16     Control-buffer 50% Glycerol  Negative    Control-Histamine 1 mg/ml  --   +/-   8. Shellfish Mix  Negative    9. Fish Mix  Negative    18. Catfish  Negative    19. Bass  Negative    20. Trout  Negative    21. Tuna  Negative    22. Salmon  Negative    23. Flounder  Negative    24. Codfish  Negative    25. Shrimp  Negative    26. Crab  Negative    27. Lobster  Negative    28. Oyster  Negative    29. Scallops  Negative       Medication List:  Current Outpatient Medications  Medication Sig Dispense Refill  . albuterol (PROVENTIL HFA;VENTOLIN HFA) 108 (90 Base) MCG/ACT inhaler Inhale into the lungs.    Marland Kitchen albuterol (PROVENTIL) (2.5 MG/3ML) 0.083% nebulizer solution Take 2.5 mg by nebulization every 4 (four) hours as needed for wheezing or shortness of breath.    . benzonatate (TESSALON) 100 MG capsule Take 1 capsule (100 mg total) by mouth every 6 (six) hours as needed for cough. 20 capsule 3  . budesonide (PULMICORT FLEXHALER) 180 MCG/ACT inhaler TWO PUFFS TWICE A DAY TO PREVENT COUGH OR WHEEZE. RINSE, GARGLE AND SPIT AFTER USE. 3 each 2  . cetirizine (ZYRTEC) 10 MG tablet Take 1 tablet (10 mg total) by mouth daily as needed for allergies or rhinitis. 90 tablet 3  . Cholecalciferol (VITAMIN D-1000 MAX ST) 1000 units tablet Take by mouth.    . diphenhydrAMINE (BENADRYL) 25 mg capsule Take 25 mg by mouth.    . docusate sodium (STOOL SOFTENER) 100 MG capsule Take 300 mg by mouth.    . EPINEPHrine (EPIPEN 2-PAK) 0.3 mg/0.3 mL IJ SOAJ injection USE AS DIRECTED FOR SEVERE ALLERGIC REACTION. 2 Device 2  . famotidine (PEPCID) 20 MG tablet Take 20 mg by mouth.    . ferrous sulfate 325 (65 FE) MG EC tablet Take 325 mg by mouth 3 (three) times daily with meals.    . fluticasone (FLONASE) 50 MCG/ACT nasal spray TWO SPRAYS EACH NOSTRIL ONCE A DAY FOR NASAL CONGESTION OR DRAINAGE. 48 g 3  . fluticasone (FLOVENT HFA) 110  MCG/ACT inhaler Inhale 1 puff into the lungs 2 (two) times daily. 3 Inhaler 3  . hydrOXYzine (ATARAX/VISTARIL) 25 MG tablet Take 50 mg by mouth.    . montelukast (SINGULAIR) 10 MG tablet Take 1 tablet (10 mg total) by mouth at bedtime. 90 tablet 3  . nitroGLYCERIN (NITROSTAT) 0.4 MG SL tablet Place 0.4 mg under the tongue.    . pantoprazole (PROTONIX) 40 MG tablet Take 40 mg by mouth.    . predniSONE (DELTASONE) 10 MG tablet Take 1 tablet twice daily for 4 days, then 1 tablet on day 5, then stop. 9 tablet 0  . pregabalin (LYRICA) 75 MG capsule Take by mouth.     No current facility-administered medications for this visit.    Allergies: Allergies  Allergen Reactions  . Acetaminophen Hives  . Aspirin Other (See Comments)    Per allergy test  . Chocolate Flavor Other (See Comments)  Rash in mouth  . Other Hives    Pain medication, pripoxyphone  . Pineapple Other (See Comments)    Rash in mouth  . Prunus Persica Other (See Comments)    Rash in mouth  . Sulfamethoxazole Hives  . Tomato Other (See Comments)    Rash in mouth  . Bee Venom Rash and Hives  . Latex Rash and Hives  . Okra Rash  . Penicillins Rash   I reviewed her past medical history, social history, family history, and environmental history and no significant changes have been reported from previous visit on 09/30/2018.  Review of Systems  Constitutional: Negative for appetite change, chills, fever and unexpected weight change.  HENT: Negative for congestion and rhinorrhea.   Eyes: Negative for itching.  Respiratory: Negative for cough, chest tightness, shortness of breath and wheezing.   Gastrointestinal: Negative for abdominal pain.  Skin: Negative for rash.  Allergic/Immunologic: Positive for environmental allergies and food allergies.  Neurological: Negative for headaches.   Objective: BP 138/78   Pulse 90   Temp (!) 96.3 F (35.7 C) (Temporal)   Resp 18   SpO2 98%  There is no height or weight on file to  calculate BMI. Physical Exam  Constitutional: She is oriented to person, place, and time. She appears well-developed and well-nourished.  HENT:  Head: Normocephalic and atraumatic.  Right Ear: External ear normal.  Left Ear: External ear normal.  Eyes: Conjunctivae and EOM are normal.  Neck: Neck supple.  Cardiovascular: Normal rate, regular rhythm and normal heart sounds. Exam reveals no gallop and no friction rub.  No murmur heard. Pulmonary/Chest: Effort normal and breath sounds normal. She has no wheezes. She has no rales.  Neurological: She is alert and oriented to person, place, and time.  Skin: Skin is warm. No rash noted.  Psychiatric: She has a normal mood and affect. Her behavior is normal.  Nursing note and vitals reviewed.  Previous notes and tests were reviewed. The plan was reviewed with the patient/family, and all questions/concerned were addressed.  It was my pleasure to see Joyce Fox today and participate in her care. Please feel free to contact me with any questions or concerns.  Sincerely,  Wyline MoodYoon Sharron Petruska, DO Allergy & Immunology  [facility] [place] office: [phone] [facility] office: [phone] [place] office: [phone]

## 2018-10-28 NOTE — Assessment & Plan Note (Signed)
Past history - 2017 skin testing was positive to grass, weed, trees, dust mite, cat, ragweed. Interim history - stable.   May use over the counter antihistamines such as Zyrtec (cetirizine), Claritin (loratadine), Allegra (fexofenadine), or Xyzal (levocetirizine) daily as needed.  May use Flonase 2 sprays in each nostril once a day.   Continue environmental control measures.

## 2018-10-28 NOTE — Assessment & Plan Note (Signed)
Doing well with below regimen. . Today's spirometry was unremarkable.  . Daily controller medication(s): Flovent 110 1 puff twice a day with spacer and rinse mouth afterwards. o Continue Singulair 10mg  daily.  . Prior to physical activity: May use albuterol rescue inhaler 2 puffs 5 to 15 minutes prior to strenuous physical activities. Marland Kitchen Rescue medications: May use albuterol rescue inhaler 2 puffs or nebulizer every 4 to 6 hours as needed for shortness of breath, chest tightness, coughing, and wheezing. Monitor frequency of use.  . During upper respiratory infections: Start Flovent 110 3 puffs three times a day for 1-2 weeks

## 2018-10-28 NOTE — Assessment & Plan Note (Signed)
Past history - 2017 skin testing to adult food panel was all negative. Allergic reaction on 7/3 requiring ER visit. Symptoms resolved with benadryl and solumedrol. Trigger may have been croaker fish. 2020 immunocap borderline positive to shrimp, negative to all others.  Interim history - No issues since avoidance.   Today's skin testing showed: Negative to seafood and shellfish but positive control was borderline.   Continue to avoid seafood and shellfish.  Schedule for in office food challenge to finned fish. Advised to bring in fish she would normally eat in the past.   Also bring in a small piece of the croaker fish you had a reaction to and we will skin test with that at next visit.   For mild symptoms you can take over the counter antihistamines such as Benadryl and monitor symptoms closely. If symptoms worsen or if you have severe symptoms including breathing issues, throat closure, significant swelling, whole body hives, severe diarrhea and vomiting, lightheadedness then inject epinephrine and seek immediate medical care afterwards.

## 2018-10-28 NOTE — Patient Instructions (Addendum)
Today's skin testing showed: Negative to seafood and shellfish but positive control was borderline.   Continue to avoid seafood and shellfish. Schedule for in office food challenge to finned fish.  You must bring in the fish you plan on eating cooked and plan on being here for 2-3 hours. No allergy medications for 3 days before.  Also bring in a small piece of the croaker fish so we can do skin testing on you as well with this.   For mild symptoms you can take over the counter antihistamines such as Benadryl and monitor symptoms closely. If symptoms worsen or if you have severe symptoms including breathing issues, throat closure, significant swelling, whole body hives, severe diarrhea and vomiting, lightheadedness then inject epinephrine and seek immediate medical care afterwards.  Moderate persistent asthma without complication  Daily controller medication(s):Flovent 110 1 puff twice a day with spacer and rinse mouth afterwards. ? Continue Singulair 10mg  daily.   Prior to physical activity:May use albuterol rescue inhaler 2 puffs 5 to 15 minutes prior to strenuous physical activities.  Rescue medications:May use albuterol rescue inhaler 2 puffs or nebulizer every 4 to 6 hours as needed for shortness of breath, chest tightness, coughing, and wheezing. Monitor frequency of use.   During upper respiratory infections: Start Flovent 110 3 puffs three times a day for 1-2 weeks Asthma control goals:  Full participation in all desired activities (may need albuterol before activity) Albuterol use two times or less a week on average (not counting use with activity) Cough interfering with sleep two times or less a month Oral steroids no more than once a year No hospitalizations  Seasonal and perennial allergic rhinitis Past history - 2017 skin testing was positive to grass, weed, trees, dust mite, cat, ragweed.  May use over the counter antihistamines such as Zyrtec (cetirizine), Claritin  (loratadine), Allegra (fexofenadine), or Xyzal (levocetirizine) daily as needed.  May use Flonase 2 sprays in each nostril once a day.   Continue environmental control measures.  Follow up for food challenge.

## 2018-11-10 NOTE — Progress Notes (Deleted)
Follow Up Note  RE: Scarlette Hogston MRN: [id number] DOB: [birth year] Date of Office Visit: 11/11/2018  Referring provider: Concepcion Elk, MD Primary care provider: Concepcion Elk, MD  Chief Complaint:No chief complaint on file.   Assessment and Plan: Joyce Fox is a 58 y.o. female with: No problem-specific Assessment & Plan notes found for this encounter.  No follow-ups on file.  Plan: Challenge food: fish Challenge as per protocol: {Blank single:19197::"Passed","Failed"} Total time: ***  Do not eat challenge food for next 24 hours and monitor for hives, swelling, shortness of breath and dizziness. If you see these symptoms, use Benadryl for mild symptoms and epinephrine for more severe symptoms and call 911.  If no adverse symptoms in the next 24 hours, repeat the challenge food the next day and observe for 1 hour. If no adverse symptoms, can eat the food on regular basis.   History of Present Illness: I had the pleasure of seeing Joyce Fox for a follow up visit at the [facility] on [facility]10[facility]2018. She is a 58 y.o. female, who is being followed for ***. Today she is here for fish food challenge. She is accompanied today by her *** who provided/contributed to the history. Her previous allergy office visit was on 10/28/2018 with Dr. Maudie Mercury.   Chief Complaint: Challenge testing to fish.  History of Reaction: Anaphylactic shock due to adverse food reaction Past history - 2017 skin testing to adult food panel was all negative. Allergic reaction on 7/3 requiring ER visit. Symptoms resolved with benadryl and solumedrol. Trigger may have been croaker fish. 2020 immunocap borderline positive to shrimp, negative to all others.  Interim history - No issues since avoidance.   Today's skin testing showed: Negative to seafood and shellfish but positive control was borderline.   Continue to avoid seafood and shellfish.  Schedule for in office food challenge to finned fish. Advised  to bring in fish she would normally eat in the past.   Also bring in a small piece of the croaker fish you had a reaction to and we will skin test with that at next visit.   For mild symptoms you can take over the counter antihistamines such as Benadryl and monitor symptoms closely. If symptoms worsen or if you have severe symptoms including breathing issues, throat closure, significant swelling, whole body hives, severe diarrhea and vomiting, lightheadedness then inject epinephrine and seek immediate medical care afterwards.  Moderate persistent asthma without complication Doing well with below regimen.  Today's spirometry was unremarkable.   Daily controller medication(s):Flovent 110 1 puff twice a day with spacer and rinse mouth afterwards. ? Continue Singulair 10mg  daily.   Prior to physical activity:May use albuterol rescue inhaler 2 puffs 5 to 15 minutes prior to strenuous physical activities.  Rescue medications:May use albuterol rescue inhaler 2 puffs or nebulizer every 4 to 6 hours as needed for shortness of breath, chest tightness, coughing, and wheezing. Monitor frequency of use.   During upper respiratory infections: Start Flovent 110 3 puffs three times a day for 1-2 weeks  Seasonal and perennial allergic rhinitis Past history - 2017 skin testing was positive to grass, weed, trees, dust mite, cat, ragweed. Interim history - stable.   May use over the counter antihistamines such as Zyrtec (cetirizine), Claritin (loratadine), Allegra (fexofenadine), or Xyzal (levocetirizine) daily as needed.  May use Flonase 2 sprays in each nostril once a day.   Continue environmental control measures.  Return for Food challenge.  Labs: 10/28/2018 skin  testing negative to fish. 10/14/2018 bloodwork  Codfish IgE Class 0 kU/L <0.10  Halibut IgE Class 0 kU/L <0.10  Allergen Walley Pike IgE Class 0 kU/L <0.10  Tuna Class 0 kU/L <0.10  Allergen Salmon IgE Class 0 kU/L <0.10  Allergen  Mackerel IgE Class 0 kU/L <0.10  Allergen Trout IgE Class 0 kU/L <0.10   Interval History: Patient has not been ill, she has not had any accidental exposures to the culprit medication.   Recent/Current History: Pulmonary disease: {Blank single:19197::"yes","no"} Cardiac disease: {Blank single:19197::"yes","no"} Respiratory infection: {Blank single:19197::"yes","no"} Rash: {Blank single:19197::"yes","no"} Itch: {Blank single:19197::"yes","no"} Swelling: {Blank single:19197::"yes","no"} Cough: {Blank single:19197::"yes","no"} Shortness of breath: {Blank single:19197::"yes","no"} Runny/stuffy nose: {Blank single:19197::"yes","no"} Itchy eyes: {Blank single:19197::"yes","no"} Beta-blocker use: {Blank single:19197::"yes","no"}  Patient/guardian was informed of the test procedure with verbalized understanding of the risk of anaphylaxis.   Last antihistamine use: *** Last beta-blocker use: ***  Medication List:  Current Outpatient Medications  Medication Sig Dispense Refill  . albuterol (PROVENTIL HFA;VENTOLIN HFA) 108 (90 Base) MCG/ACT inhaler Inhale into the lungs.    Marland Kitchen. albuterol (PROVENTIL) (2.5 MG/3ML) 0.083% nebulizer solution Take 2.5 mg by nebulization every 4 (four) hours as needed for wheezing or shortness of breath.    . benzonatate (TESSALON) 100 MG capsule Take 1 capsule (100 mg total) by mouth every 6 (six) hours as needed for cough. 20 capsule 3  . budesonide (PULMICORT FLEXHALER) 180 MCG/ACT inhaler TWO PUFFS TWICE A DAY TO PREVENT COUGH OR WHEEZE. RINSE, GARGLE AND SPIT AFTER USE. 3 each 2  . cetirizine (ZYRTEC) 10 MG tablet Take 1 tablet (10 mg total) by mouth daily as needed for allergies or rhinitis. 90 tablet 3  . Cholecalciferol (VITAMIN D-1000 MAX ST) 1000 units tablet Take by mouth.    . diphenhydrAMINE (BENADRYL) 25 mg capsule Take 25 mg by mouth.    . docusate sodium (STOOL SOFTENER) 100 MG capsule Take 300 mg by mouth.    . EPINEPHrine (EPIPEN 2-PAK) 0.3 mg/0.3  mL IJ SOAJ injection USE AS DIRECTED FOR SEVERE ALLERGIC REACTION. 2 Device 2  . famotidine (PEPCID) 20 MG tablet Take 20 mg by mouth.    . ferrous sulfate 325 (65 FE) MG EC tablet Take 325 mg by mouth 3 (three) times daily with meals.    . fluticasone (FLONASE) 50 MCG/ACT nasal spray TWO SPRAYS EACH NOSTRIL ONCE A DAY FOR NASAL CONGESTION OR DRAINAGE. 48 g 3  . fluticasone (FLOVENT HFA) 110 MCG/ACT inhaler Inhale 1 puff into the lungs 2 (two) times daily. 3 Inhaler 3  . hydrOXYzine (ATARAX/VISTARIL) 25 MG tablet Take 50 mg by mouth.    . montelukast (SINGULAIR) 10 MG tablet Take 1 tablet (10 mg total) by mouth at bedtime. 90 tablet 3  . nitroGLYCERIN (NITROSTAT) 0.4 MG SL tablet Place 0.4 mg under the tongue.    . pantoprazole (PROTONIX) 40 MG tablet Take 40 mg by mouth.    . predniSONE (DELTASONE) 10 MG tablet Take 1 tablet twice daily for 4 days, then 1 tablet on day 5, then stop. 9 tablet 0  . pregabalin (LYRICA) 75 MG capsule Take by mouth.     No current facility-administered medications for this visit.     Allergies: Allergies  Allergen Reactions  . Acetaminophen Hives  . Aspirin Other (See Comments)    Per allergy test  . Chocolate Flavor Other (See Comments)    Rash in mouth  . Other Hives    Pain medication, pripoxyphone  . Pineapple Other (See Comments)  Rash in mouth  . Prunus Persica Other (See Comments)    Rash in mouth  . Sulfamethoxazole Hives  . Tomato Other (See Comments)    Rash in mouth  . Bee Venom Rash and Hives  . Latex Rash and Hives  . Okra Rash  . Penicillins Rash    I reviewed her past medical history, social history, family history, and environmental history and no significant changes have been reported from previous visit on ***.  Review of Systems  Objective: There were no vitals taken for this visit. There is no height or weight on file to calculate BMI. Physical Exam  Diagnostics: Spirometry:  Tracings reviewed. Her effort: ***.  {Blank single:19197::"results normal (FEV1: ***%, FVC: ***%, FEV1/FVC: ***%)","results abnormal (FEV1: ***%, FVC: ***%, FEV1/FVC: ***%)"}.  Interpretation: {Blank single:19197::"Spirometry consistent with mild obstructive disease","Spirometry consistent with moderate obstructive disease","Spirometry consistent with severe obstructive disease","Spirometry consistent with possible restrictive disease","Spirometry consistent with mixed obstructive and restrictive disease","Spirometry uninterpretable due to technique","Spirometry consistent with normal pattern"}.  {Blank single:19197::"Albuterol/Atrovent nebulizer","Xopenex/Atrovent nebulizer","Albuterol nebulizer"} treatment given in clinic with {Blank single:19197::"significant improvement in FEV1 per ATS criteria","significant improvement in FVC per ATS criteria","significant improvement in FEV1 and FVC per ATS criteria","improvement in FEV1, but not significant per ATS criteria","improvement in FVC, but not significant per ATS criteria","improvement in FEV1 and FVC, but not significant per ATS criteria","no improvement"}. Results discussed with patient/family.  Skin Testing: {Blank single:19197::"None","Deferred due to recent antihistamines use"}. Positive test to: ***. Negative test to: ***.  Results discussed with patient/family.   Previous notes and tests were reviewed. The plan was reviewed with the patient/family, and all questions/concerned were addressed.  It was my pleasure to see [name] today and participate in her care. Please feel free to contact me with any questions or concerns.  Sincerely,  Wyline MoodYoon Arlinda Barcelona, DO Allergy & Immunology  [facility] [place] office: [phone] [facility] office:[phone] [place] office: [phone]

## 2018-11-11 ENCOUNTER — Encounter: Payer: Self-pay | Admitting: Allergy

## 2018-12-08 ENCOUNTER — Ambulatory Visit: Payer: Medicare HMO | Admitting: Family Medicine

## 2019-07-14 ENCOUNTER — Telehealth: Payer: Self-pay | Admitting: Pediatrics

## 2019-07-14 NOTE — Telephone Encounter (Signed)
Please advise 

## 2019-07-14 NOTE — Telephone Encounter (Signed)
Pt. Is asking which vaccine would be safer for her to take.

## 2019-07-17 NOTE — Telephone Encounter (Signed)
Either [employer] or [employer]

## 2019-07-17 NOTE — Telephone Encounter (Signed)
Patient informed. 

## 2019-07-18 ENCOUNTER — Encounter: Payer: Self-pay | Admitting: Family Medicine

## 2019-07-18 ENCOUNTER — Ambulatory Visit: Payer: Medicare HMO | Admitting: Family Medicine

## 2019-07-18 ENCOUNTER — Other Ambulatory Visit: Payer: Self-pay

## 2019-07-18 VITALS — BP 146/82 | HR 86 | Temp 98.0°F | Resp 20 | Ht 67.7 in | Wt 213.8 lb

## 2019-07-18 DIAGNOSIS — J3089 Other allergic rhinitis: Secondary | ICD-10-CM

## 2019-07-18 DIAGNOSIS — J302 Other seasonal allergic rhinitis: Secondary | ICD-10-CM

## 2019-07-18 DIAGNOSIS — J4540 Moderate persistent asthma, uncomplicated: Secondary | ICD-10-CM

## 2019-07-18 DIAGNOSIS — T7800XD Anaphylactic reaction due to unspecified food, subsequent encounter: Secondary | ICD-10-CM | POA: Diagnosis not present

## 2019-07-18 MED ORDER — BUDESONIDE-FORMOTEROL FUMARATE 80-4.5 MCG/ACT IN AERO: INHALATION_SPRAY | RESPIRATORY_TRACT | 5 refills | Status: DC

## 2019-07-18 MED ORDER — FLUCONAZOLE 150 MG PO TABS: ORAL_TABLET | ORAL | 0 refills | Status: DC

## 2019-07-18 NOTE — Progress Notes (Signed)
[place] [id number] Dept: [phone]  FOLLOW UP NOTE  Patient ID: Keyanah Kozicki, female    DOB: 1960-09-17  Age: 59 y.o. MRN: [id number] Date of Office Visit: 07/18/2019  Assessment  Chief Complaint: Asthma (and hx of shellfish allergy), Allergic Rhinitis , and Medication Refill  HPI Adaia Matthies is a 59 year old female who presents for follow-up of moderate persistent asthma, seasonal and perennial allergic rhinitis, and food allergy.  She was last seen on October 28, 2018 by Dr. Maudie Mercury.  Asthma is reported as not well controlled with Flovent 110 mcg 1 puff twice a day, Singulair 10 mg once a day and albuterol as needed.  She notes increased clear vaginal discharge with no odor since being switched from Pulmicort flexihaler to Flovent.  She also felt that her asthma was under better control with Pulmicort.  She is currently using her albuterol inhaler 3-8 times per week due to being outside in the pollen.  She reports occasional shortness of breath with exertion and coughing and wheezing when she is outside with pollen.  She denies any tightness in her chest, fever, or chills.  Her cough is dry.  She has not required any systemic steroids since her last office visit and has not made any trips to urgent care.  She did report that she was admitted to the hospital on January 14, 2019 for grieving due to losing her brother.  Seasonal and perennial allergic rhinitis reported as moderately controlled with the use of Flonase 2 sprays each nostril once a day as needed and Benadryl as needed.  She reports occasional runny/stuffy nose and sneezing.  She denies any itchy or watery eyes.  She does report that since yesterday her right eyelid has been itchy and a little swollen with yellow crust on her eyelashes in the morning.  This past weekend she ate 1 piece, approximately 3 ounces, of flounder with no problem.  She continues to avoid shellfish.   Drug Allergies:  Allergies    Allergen Reactions  . Acetaminophen Hives  . Aspirin Other (See Comments)    Per allergy test  . Chocolate Flavor Other (See Comments)    Rash in mouth  . Other Hives    Pain medication, pripoxyphone  . Pineapple Other (See Comments)    Rash in mouth  . Prunus Persica Other (See Comments)    Rash in mouth  . Sulfamethoxazole Hives  . Tomato Other (See Comments)    Rash in mouth  . Bee Venom Rash and Hives  . Latex Rash and Hives  . Okra Rash  . Penicillins Rash    Physical Exam: BP (!) 146/82 (BP Location: Left Arm, Patient Position: Sitting, Cuff Size: Large)   Pulse 86   Temp 98 F (36.7 C) (Oral)   Resp 20   Ht 5' 7.7" (1.72 m)   Wt 213 lb 12.8 oz (97 kg)   SpO2 99%   BMI 32.80 kg/m    Physical Exam Constitutional:      Appearance: Normal appearance.  HENT:     Head: Normocephalic and atraumatic.     Comments: Pharynx normal. Eyes normal. Right upper eyelid slightly erythematous and edematous. Ears normal. Nose normal.    Right Ear: Tympanic membrane, ear canal and external ear normal.     Left Ear: Tympanic membrane, ear canal and external ear normal.     Nose: Nose normal.     Mouth/Throat:     Mouth: Mucous membranes are  moist.  Eyes:     Conjunctiva/sclera: Conjunctivae normal.     Comments: Right upper eyelid slightly edematous and erythematous  Cardiovascular:     Rate and Rhythm: Normal rate and regular rhythm.     Heart sounds: Normal heart sounds.  Pulmonary:     Effort: Pulmonary effort is normal.     Breath sounds: Normal breath sounds.     Comments: Lungs clear to auscultation Skin:    General: Skin is warm.  Neurological:     General: No focal deficit present.     Mental Status: She is alert and oriented to person, place, and time.  Psychiatric:        Mood and Affect: Mood normal.        Behavior: Behavior normal.        Thought Content: Thought content normal.        Judgment: Judgment normal.     Diagnostics: FVC 2.34 L, FEV1  1.97 L.  Predicted FVC 3.11 L, FEV1 2.45 L.  Spirometry suggest mild restriction.  Assessment and Plan: 1. Moderate persistent asthma without complication   2. Anaphylactic shock due to food, subsequent encounter   3. Seasonal and perennial allergic rhinitis     Meds ordered this encounter  Medications  . budesonide-formoterol (SYMBICORT) 80-4.5 MCG/ACT inhaler    Sig: Two puffs with spacer twice a day. Rinse mouth out afterwards to help prevent thrush.    Dispense:  10.2 g    Refill:  5  . fluconazole (DIFLUCAN) 150 MG tablet    Sig: Take one tablet today.  May repeat the dose in 3 days.    Dispense:  2 tablet    Refill:  0    Patient Instructions  Asthma Stop Flovent 110 Start Symbicort 80-use 2 puffs twice a day with spacer.  Rinse mouth out afterwards to help prevent thrush. Continue Singulair 10 mg once a day to help prevent cough and wheeze. May use albuterol 2 puffs every 4 hours as needed for cough, wheeze, tightness in chest or shortness of breath.  Also may use albuterol 2 puffs 5 to 15 minutes prior to exercise  Food allergy Continue to avoid shellfish.  In case of an allergic reaction, give Benadryl 50 mg  every 4 hours, and if life-threatening symptoms occur, inject with EpiPen 0.3 mg.  Allergic rhinitis Continue Flonase 2 sprays each nostril once a day to help prevent stuffy nose May use over-the-counter antihistamine such as Zyrtec, Claritin, Allegra, or Xyzal daily as needed for runny nose or itching.  Take Diflucan 150 mg-take 1 tablet today and may repeat the dose in 3 days. May use hydrocortisone 1%- using one application twice a day to right eyelid to help with itching  Continue all other current medication Please let us know if this treatment plan is not working well for you  Schedule follow-up appointment in 3 months   Return in about 3 months (around 10/17/2019), or if symptoms worsen or fail to improve.   Thank you for the opportunity to care for  this patient.  Please do not hesitate to contact me with questions.  Nehemiah Settle, FNP [facility]   I have provided oversight concerning Sherrie George' evaluation and treatment of this patient's health issues addressed during today's encounter. I agree with the assessment and therapeutic plan as outlined in the note.   Thank you for the opportunity to care for this patient.  Please  do not hesitate to contact me with questions.  Tonette Bihari, M.D.  [facility] [address] [place], [place] [address] [phone]

## 2019-07-18 NOTE — Patient Instructions (Signed)
Asthma Stop Flovent 110 Start Symbicort 80-use 2 puffs twice a day with spacer.  Rinse mouth out afterwards to help prevent thrush. Continue Singulair 10 mg once a day to help prevent cough and wheeze. May use albuterol 2 puffs every 4 hours as needed for cough, wheeze, tightness in chest or shortness of breath.  Also may use albuterol 2 puffs 5 to 15 minutes prior to exercise  Food allergy Continue to avoid shellfish.  In case of an allergic reaction, give Benadryl 50 mg  every 4 hours, and if life-threatening symptoms occur, inject with EpiPen 0.3 mg.  Allergic rhinitis Continue Flonase 2 sprays each nostril once a day to help prevent stuffy nose May use over-the-counter antihistamine such as Zyrtec, Claritin, Allegra, or Xyzal daily as needed for runny nose or itching.  Take Diflucan 150 mg-take 1 tablet today and may repeat the dose in 3 days. May use hydrocortisone 1%- using one application twice a day to right eyelid to help with itching  Continue all other current medication Please let us know if this treatment plan is not working well for you  Schedule follow-up appointment in 3 months

## 2019-10-19 ENCOUNTER — Ambulatory Visit: Payer: Medicare HMO | Admitting: Family Medicine

## 2019-10-19 NOTE — Progress Notes (Deleted)
   [address] [place] [address] Dept: [phone]  FOLLOW UP NOTE  Patient ID: Henretta Quist, female    DOB: 01-15-1961  Age: 59 y.o. MRN: [id number] Date of Office Visit: 10/19/2019  Assessment  Chief Complaint: No chief complaint on file.  HPI Allizon Woznick    Drug Allergies:  Allergies  Allergen Reactions  . Acetaminophen Hives  . Aspirin Other (See Comments)    Per allergy test  . Chocolate Flavor Other (See Comments)    Rash in mouth  . Other Hives    Pain medication, pripoxyphone  . Pineapple Other (See Comments)    Rash in mouth  . Prunus Persica Other (See Comments)    Rash in mouth  . Sulfamethoxazole Hives  . Tomato Other (See Comments)    Rash in mouth  . Bee Venom Rash and Hives  . Latex Rash and Hives  . Okra Rash  . Penicillins Rash    Physical Exam: There were no vitals taken for this visit.   Physical Exam  Diagnostics:    Assessment and Plan: No diagnosis found.  No orders of the defined types were placed in this encounter.   There are no Patient Instructions on file for this visit.  No follow-ups on file.    Thank you for the opportunity to care for this patient.  Please do not hesitate to contact me with questions.  Thermon Leyland, FNP [facility]

## 2019-10-20 ENCOUNTER — Ambulatory Visit: Payer: Medicare HMO | Admitting: Family Medicine

## 2019-10-25 ENCOUNTER — Ambulatory Visit: Payer: Medicare HMO | Admitting: Family Medicine

## 2019-10-25 ENCOUNTER — Other Ambulatory Visit: Payer: Self-pay

## 2019-10-25 ENCOUNTER — Telehealth: Payer: Self-pay | Admitting: Family Medicine

## 2019-10-25 ENCOUNTER — Encounter: Payer: Self-pay | Admitting: Family Medicine

## 2019-10-25 VITALS — BP 130/78 | HR 82 | Temp 98.1°F | Resp 15

## 2019-10-25 DIAGNOSIS — J4540 Moderate persistent asthma, uncomplicated: Secondary | ICD-10-CM | POA: Diagnosis not present

## 2019-10-25 DIAGNOSIS — T7800XD Anaphylactic reaction due to unspecified food, subsequent encounter: Secondary | ICD-10-CM | POA: Diagnosis not present

## 2019-10-25 DIAGNOSIS — K219 Gastro-esophageal reflux disease without esophagitis: Secondary | ICD-10-CM | POA: Diagnosis not present

## 2019-10-25 DIAGNOSIS — J3089 Other allergic rhinitis: Secondary | ICD-10-CM

## 2019-10-25 DIAGNOSIS — J302 Other seasonal allergic rhinitis: Secondary | ICD-10-CM

## 2019-10-25 DIAGNOSIS — L2084 Intrinsic (allergic) eczema: Secondary | ICD-10-CM

## 2019-10-25 MED ORDER — PANTOPRAZOLE SODIUM 40 MG PO TBEC: DELAYED_RELEASE_TABLET | ORAL | 1 refills | Status: DC

## 2019-10-25 MED ORDER — PULMICORT FLEXHALER 180 MCG/ACT IN AEPB
2.0000 | INHALATION_SPRAY | Freq: Two times a day (BID) | RESPIRATORY_TRACT | 5 refills | Status: DC
Start: 2019-10-25 — End: 2022-04-21

## 2019-10-25 NOTE — Progress Notes (Addendum)
[address] [place] [address] Dept: [phone]  FOLLOW UP NOTE  Patient ID: Joyce Fox, female    DOB: 1960/07/30  Age: 59 y.o. MRN: [id number] Date of Office Visit: 10/25/2019  Assessment  Chief Complaint: Allergic Rhinitis  and Asthma  HPI Joyce Fox is a 59 year old female who presents to the clinic for follow-up visit.  She was last seen in this clinic on 07/18/2019 by Nehemiah Settle, FNP, for evaluation of asthma, allergic rhinitis, and food allergy to shellfish.  At that time she was changed from Flovent 110 to Symbicort 80 due to reported frequent vaginal yeast infections.  At today's visit she reports her asthma has been moderately well controlled with shortness of breath with activity such as climbing up stairs, wheezing aggravated by heat, and occasional cough that produces clear mucus she continues montelukast 10 mg once a day, Symbicort 160-2 puffs once a day, and albuterol 1-2 times a week.  She reports that after using Symbicort she feels dizzy, wobbly on her feet, and heart racing for several hours she reports that she does not feel her heart racing, dizzy, wobbly, or heart palpitations with albuterol.  Allergic rhinitis is reported as moderately well controlled with occasional sneezing and postnasal drainage for which she uses cetirizine 10 mg once a day and occasionally uses Flonase she continues to avoid shellfish with no accidental ingestion or epinephrine use since her last visit to this clinic.  Atopic dermatitis is reported as well controlled with a daily moisturizing cream and occasional use of triamcinolone to red itchy areas below her neck and face.  Of note, she does report that she received Materna Covid vaccine on April 28 about 1 week later began to experience raised red itchy areas that were "knot-like feeling".  She reports, that at that time, she went to her primary care provider and received a steroid taper as well as topical steroid cream.   She reports that she experiences these raised, red areas every few days.  She reports that her primary care provider advised her not to get an additional Covid vaccine.  The patient has no interest in getting a second Covid vaccine.  Her current medications are listed in the chart.   Drug Allergies:  Allergies  Allergen Reactions   Acetaminophen Hives   Aspirin Other (See Comments)    Per allergy test   Chocolate Flavor Other (See Comments)    Rash in mouth   Other Hives    Pain medication, pripoxyphone   Pineapple Other (See Comments)    Rash in mouth   Prunus Persica Other (See Comments)    Rash in mouth   Sulfamethoxazole Hives   Tomato Other (See Comments)    Rash in mouth   Bee Venom Rash and Hives   Latex Rash and Hives   Okra Rash   Penicillins Rash    Physical Exam: BP 130/78 (BP Location: Right Arm, Patient Position: Sitting, Cuff Size: Normal)    Pulse 82    Temp 98.1 F (36.7 C) (Oral)    Resp 15    SpO2 98%    Physical Exam Vitals reviewed.  Constitutional:      Appearance: Normal appearance.  HENT:     Head: Normocephalic and atraumatic.     Right Ear: Tympanic membrane normal.     Left Ear: Tympanic membrane normal.     Nose:     Comments: Bilateral nares slightly erythematous with clear nasal drainage noted.  Pharynx  normal.  Ears normal.  Eyes normal.    Mouth/Throat:     Pharynx: Oropharynx is clear.  Eyes:     Conjunctiva/sclera: Conjunctivae normal.  Cardiovascular:     Rate and Rhythm: Normal rate and regular rhythm.     Heart sounds: Normal heart sounds. No murmur heard.   Pulmonary:     Effort: Pulmonary effort is normal.     Breath sounds: Normal breath sounds.     Comments: Lungs clear to auscultation Musculoskeletal:        General: Normal range of motion.     Cervical back: Normal range of motion and neck supple.  Skin:    General: Skin is warm and dry.  Neurological:     Mental Status: She is alert and oriented to  person, place, and time.  Psychiatric:        Mood and Affect: Mood normal.        Behavior: Behavior normal.        Thought Content: Thought content normal.        Judgment: Judgment normal.    Diagnostics: FVC 2.27, FEV1 2.02.  Predicted FVC 3.11, predicted FEV1 2.45.  Spirometry indicates mild restriction.  This is consistent with previous spirometry readings.  Assessment and Plan: 1. Moderate persistent asthma, unspecified whether complicated   2. Seasonal and perennial allergic rhinitis   3. Anaphylactic shock due to food, subsequent encounter     Meds ordered this encounter  Medications   pantoprazole (PROTONIX) 40 MG tablet    Sig: Take one tablet once daily for reflux    Dispense:  90 tablet    Refill:  1    Dispense 90 day supply.   budesonide (PULMICORT FLEXHALER) 180 MCG/ACT inhaler    Sig: Inhale 2 puffs into the lungs 2 (two) times daily.    Dispense:  1 each    Refill:  5    Patient Instructions  Asthma Stop Symbicort 160 and begin Pulmicort 90- 2 puffs twice a day to prevent cough and wheeze Continue Singulair 10 mg once a day to help prevent cough and wheeze. May use albuterol 2 puffs every 4 hours as needed for cough, wheeze, tightness in chest or shortness of breath.   You may use albuterol 2 puffs 5 to 15 minutes prior to exercise  Allergic rhinitis Continue Flonase 2 sprays each nostril once a day to help prevent stuffy nose.  In the right nostril, point the applicator out toward the right ear. In the left nostril, point the applicator out toward the left ear May use over-the-counter antihistamine such as Zyrtec, Claritin, Allegra, or Xyzal 5 mg once a day as needed for runny nose or itching.  Reflux Continue dietary and lifestyle modifications as listed below Continue pantoprazole 40 mg once a day as you have been  Atopic dermatitis Continue a daily moisturizing routine Continue hydrocortisone 1% to red itchy areas on your face and neck Continue  triamcinolone 0.1% ointment to red itchy areas below your face or neck twice a day as needed  Food allergy Continue to avoid shellfish. In case of an allergic reaction, give Benadryl 50 mg  every 4 hours, and if life-threatening symptoms occur, inject with EpiPen 0.3 mg.  Please let us know if this treatment plan is not working well for you  Follow up in 3 months or sooner if needed.    Return in about 3 months (around 01/25/2020), or if symptoms worsen or fail to improve.  Thank you for the opportunity to care for this patient.  Please do not hesitate to contact me with questions.  Thermon Leyland, FNP [facility]  ________________________________________________  I have provided oversight concerning [facility]'s evaluation and treatment of this patient's health issues addressed during today's encounter.  I agree with the assessment and therapeutic plan as outlined in the note.   Signed,   R Jorene Guest, MD

## 2019-10-25 NOTE — Patient Instructions (Addendum)
Asthma Stop Symbicort 160 and begin Pulmicort 90- 2 puffs twice a day to prevent cough and wheeze Continue Singulair 10 mg once a day to help prevent cough and wheeze. May use albuterol 2 puffs every 4 hours as needed for cough, wheeze, tightness in chest or shortness of breath.   You may use albuterol 2 puffs 5 to 15 minutes prior to exercise  Allergic rhinitis Continue Flonase 2 sprays each nostril once a day to help prevent stuffy nose.  In the right nostril, point the applicator out toward the right ear. In the left nostril, point the applicator out toward the left ear May use over-the-counter antihistamine such as Zyrtec, Claritin, Allegra, or Xyzal 5 mg once a day as needed for runny nose or itching.  Reflux Continue dietary and lifestyle modifications as listed below Continue pantoprazole 40 mg once a day as you have been  Atopic dermatitis Continue a daily moisturizing routine Continue hydrocortisone 1% to red itchy areas on your face and neck Continue triamcinolone 0.1% ointment to red itchy areas below your face or neck twice a day as needed  Food allergy Continue to avoid shellfish. In case of an allergic reaction, give Benadryl 50 mg  every 4 hours, and if life-threatening symptoms occur, inject with EpiPen 0.3 mg.  Please let us know if this treatment plan is not working well for you  Follow up in 3 months or sooner if needed.

## 2019-10-25 NOTE — Telephone Encounter (Signed)
Patient notified of the change from Pulmicort 180 to Pulmicort 90. She reports that pantoprazole 40 mg controls her reflux. All questions answered. Will send out an AVS reflecting these changes.

## 2020-03-13 ENCOUNTER — Emergency Department (HOSPITAL_BASED_OUTPATIENT_CLINIC_OR_DEPARTMENT_OTHER)
Admission: EM | Admit: 2020-03-13 | Discharge: 2020-03-13 | Disposition: A | Discharge status: Home / Self Care | Payer: Medicare HMO | Attending: Emergency Medicine | Admitting: Emergency Medicine

## 2020-03-13 ENCOUNTER — Emergency Department (HOSPITAL_BASED_OUTPATIENT_CLINIC_OR_DEPARTMENT_OTHER): Payer: Medicare HMO

## 2020-03-13 ENCOUNTER — Other Ambulatory Visit: Payer: Self-pay

## 2020-03-13 ENCOUNTER — Encounter (HOSPITAL_BASED_OUTPATIENT_CLINIC_OR_DEPARTMENT_OTHER): Payer: Self-pay | Admitting: *Deleted

## 2020-03-13 DIAGNOSIS — S138XXA Sprain of joints and ligaments of other parts of neck, initial encounter: Secondary | ICD-10-CM | POA: Diagnosis not present

## 2020-03-13 DIAGNOSIS — S139XXA Sprain of joints and ligaments of unspecified parts of neck, initial encounter: Secondary | ICD-10-CM

## 2020-03-13 DIAGNOSIS — Z91040 Latex allergy status: Secondary | ICD-10-CM | POA: Diagnosis not present

## 2020-03-13 DIAGNOSIS — S060X0A Concussion without loss of consciousness, initial encounter: Secondary | ICD-10-CM

## 2020-03-13 DIAGNOSIS — J45909 Unspecified asthma, uncomplicated: Secondary | ICD-10-CM | POA: Insufficient documentation

## 2020-03-13 MED ORDER — OXYCODONE HCL 5 MG PO TABS
5.0000 mg | ORAL_TABLET | Freq: Once | ORAL | Status: AC
  Administered 2020-03-13: 16:00:00 5 mg via ORAL
  Filled 2020-03-13: qty 1
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
## 2020-03-13 NOTE — ED Notes (Signed)
Discharge instructions reviewed with patient, no further questions, unable to sign d/t signature pad not functioning.

## 2020-04-24 ENCOUNTER — Emergency Department (HOSPITAL_COMMUNITY)
Admission: EM | Admit: 2020-04-24 | Discharge: 2020-04-24 | Disposition: A | Discharge status: Home / Self Care | Payer: Medicare HMO | Attending: Emergency Medicine | Admitting: Emergency Medicine

## 2020-04-24 ENCOUNTER — Encounter (HOSPITAL_COMMUNITY): Payer: Self-pay

## 2020-04-24 ENCOUNTER — Emergency Department (HOSPITAL_BASED_OUTPATIENT_CLINIC_OR_DEPARTMENT_OTHER)
Admission: EM | Admit: 2020-04-24 | Discharge: 2020-04-25 | Disposition: A | Discharge status: Home / Self Care | Payer: Medicare HMO | Source: Home / Self Care | Attending: Emergency Medicine | Admitting: Emergency Medicine

## 2020-04-24 ENCOUNTER — Encounter (HOSPITAL_BASED_OUTPATIENT_CLINIC_OR_DEPARTMENT_OTHER): Payer: Self-pay

## 2020-04-24 ENCOUNTER — Other Ambulatory Visit: Payer: Self-pay

## 2020-04-24 DIAGNOSIS — Z91040 Latex allergy status: Secondary | ICD-10-CM | POA: Insufficient documentation

## 2020-04-24 DIAGNOSIS — R519 Headache, unspecified: Secondary | ICD-10-CM | POA: Insufficient documentation

## 2020-04-24 DIAGNOSIS — R030 Elevated blood-pressure reading, without diagnosis of hypertension: Secondary | ICD-10-CM | POA: Insufficient documentation

## 2020-04-24 DIAGNOSIS — R42 Dizziness and giddiness: Secondary | ICD-10-CM | POA: Insufficient documentation

## 2020-04-24 DIAGNOSIS — G43409 Hemiplegic migraine, not intractable, without status migrainosus: Secondary | ICD-10-CM

## 2020-04-24 DIAGNOSIS — Z7951 Long term (current) use of inhaled steroids: Secondary | ICD-10-CM | POA: Diagnosis not present

## 2020-04-24 DIAGNOSIS — H53149 Visual discomfort, unspecified: Secondary | ICD-10-CM | POA: Insufficient documentation

## 2020-04-24 DIAGNOSIS — Z5321 Procedure and treatment not carried out due to patient leaving prior to being seen by health care provider: Secondary | ICD-10-CM | POA: Insufficient documentation

## 2020-04-24 DIAGNOSIS — J45909 Unspecified asthma, uncomplicated: Secondary | ICD-10-CM | POA: Insufficient documentation

## 2020-04-24 HISTORY — DX: Migraine, unspecified, not intractable, without status migrainosus: G43.909
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
## 2020-04-25 ENCOUNTER — Encounter (HOSPITAL_BASED_OUTPATIENT_CLINIC_OR_DEPARTMENT_OTHER): Payer: Self-pay | Admitting: Emergency Medicine

## 2020-04-25 MED ORDER — DIPHENHYDRAMINE HCL 50 MG/ML IJ SOLN
25.0000 mg | Freq: Once | INTRAMUSCULAR | Status: AC
  Administered 2020-04-25: 25 mg via INTRAVENOUS
  Filled 2020-04-25: qty 1

## 2020-04-25 MED ORDER — METOCLOPRAMIDE HCL 5 MG/ML IJ SOLN
10.0000 mg | Freq: Once | INTRAMUSCULAR | Status: AC
  Administered 2020-04-25: 10 mg via INTRAVENOUS
  Filled 2020-04-25: qty 2

## 2020-04-25 MED ORDER — SODIUM CHLORIDE 0.9 % IV BOLUS
1000.0000 mL | Freq: Once | INTRAVENOUS | Status: AC
  Administered 2020-04-25: 1000 mL via INTRAVENOUS
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
## 2020-04-25 NOTE — ED Notes (Signed)
Pt ambulatory with 2 person assistance to restroom and pt used wheelchair to return from restroom to room. Pt tolerated well.

## 2020-05-03 ENCOUNTER — Other Ambulatory Visit: Payer: Self-pay

## 2020-05-03 MED ORDER — EPINEPHRINE 0.3 MG/0.3ML IJ SOAJ: INTRAMUSCULAR | 2 refills | Status: DC

## 2020-05-06 ENCOUNTER — Other Ambulatory Visit: Payer: Self-pay

## 2020-05-06 MED ORDER — EPINEPHRINE 0.3 MG/0.3ML IJ SOAJ: INTRAMUSCULAR | 2 refills | Status: DC

## 2020-06-30 ENCOUNTER — Emergency Department (HOSPITAL_BASED_OUTPATIENT_CLINIC_OR_DEPARTMENT_OTHER)
Admission: EM | Admit: 2020-06-30 | Discharge: 2020-06-30 | Disposition: A | Discharge status: Home / Self Care | Payer: Medicare HMO | Attending: Emergency Medicine | Admitting: Emergency Medicine

## 2020-06-30 ENCOUNTER — Emergency Department (HOSPITAL_BASED_OUTPATIENT_CLINIC_OR_DEPARTMENT_OTHER): Payer: Medicare HMO

## 2020-06-30 ENCOUNTER — Encounter (HOSPITAL_BASED_OUTPATIENT_CLINIC_OR_DEPARTMENT_OTHER): Payer: Self-pay | Admitting: Emergency Medicine

## 2020-06-30 ENCOUNTER — Other Ambulatory Visit: Payer: Self-pay

## 2020-06-30 DIAGNOSIS — R519 Headache, unspecified: Secondary | ICD-10-CM | POA: Insufficient documentation

## 2020-06-30 DIAGNOSIS — Z91040 Latex allergy status: Secondary | ICD-10-CM | POA: Insufficient documentation

## 2020-06-30 DIAGNOSIS — R42 Dizziness and giddiness: Secondary | ICD-10-CM | POA: Insufficient documentation

## 2020-06-30 DIAGNOSIS — Z7951 Long term (current) use of inhaled steroids: Secondary | ICD-10-CM | POA: Diagnosis not present

## 2020-06-30 DIAGNOSIS — J45909 Unspecified asthma, uncomplicated: Secondary | ICD-10-CM | POA: Insufficient documentation

## 2020-06-30 DIAGNOSIS — R55 Syncope and collapse: Secondary | ICD-10-CM | POA: Diagnosis not present

## 2020-06-30 LAB — COMPREHENSIVE METABOLIC PANEL
ALT: 22 U/L (ref 0–44)
AST: 32 U/L (ref 15–41)
Albumin: 3.4 g/dL — ABNORMAL LOW (ref 3.5–5.0)
Alkaline Phosphatase: 113 U/L (ref 38–126)
Anion gap: 8 (ref 5–15)
BUN: 14 mg/dL (ref 8–23)
CO2: 23 mmol/L (ref 22–32)
Calcium: 9.1 mg/dL (ref 8.9–10.3)
Chloride: 107 mmol/L (ref 98–111)
Creatinine, Ser: 1.02 mg/dL — ABNORMAL HIGH (ref 0.44–1.00)
GFR, Estimated: >60 mL/min (ref >60)
Glucose, Bld: 105 mg/dL — ABNORMAL HIGH (ref 70–99)
Potassium: 3.8 mmol/L (ref 3.5–5.1)
Sodium: 138 mmol/L (ref 135–145)
Total Bilirubin: 0.1 mg/dL — ABNORMAL LOW (ref 0.3–1.2)
Total Protein: 7.8 g/dL (ref 6.5–8.1)

## 2020-06-30 LAB — CBC WITH DIFFERENTIAL/PLATELET
Abs Immature Granulocytes: 0.06 10*3/uL (ref 0.00–0.07)
Basophils Absolute: 0.1 10*3/uL (ref 0.0–0.1)
Basophils Relative: 0 %
Eosinophils Absolute: 0.1 10*3/uL (ref 0.0–0.5)
Eosinophils Relative: 0 %
HCT: 43.2 % (ref 36.0–46.0)
Hemoglobin: 13.7 g/dL (ref 12.0–15.0)
Immature Granulocytes: 0 %
Lymphocytes Relative: 13 %
Lymphs Abs: 1.9 10*3/uL (ref 0.7–4.0)
MCH: 26.4 pg (ref 26.0–34.0)
MCHC: 31.7 g/dL (ref 30.0–36.0)
MCV: 83.4 fL (ref 80.0–100.0)
Monocytes Absolute: 0.6 10*3/uL (ref 0.1–1.0)
Monocytes Relative: 4 %
Neutro Abs: 11.8 10*3/uL — ABNORMAL HIGH (ref 1.7–7.7)
Neutrophils Relative %: 83 %
Platelets: 388 10*3/uL (ref 150–400)
RBC: 5.18 MIL/uL — ABNORMAL HIGH (ref 3.87–5.11)
RDW: 15.2 % (ref 11.5–15.5)
WBC: 14.4 10*3/uL — ABNORMAL HIGH (ref 4.0–10.5)
nRBC: 0 % (ref 0.0–0.2)

## 2020-06-30 LAB — URINALYSIS, ROUTINE W REFLEX MICROSCOPIC
Bilirubin Urine: NEGATIVE
Glucose, UA: NEGATIVE mg/dL
Hgb urine dipstick: NEGATIVE
Ketones, ur: NEGATIVE mg/dL
Leukocytes,Ua: NEGATIVE
Nitrite: NEGATIVE
Protein, ur: NEGATIVE mg/dL
Specific Gravity, Urine: 1.01 (ref 1.005–1.030)
pH: 5.5 (ref 5.0–8.0)

## 2020-06-30 LAB — CBG MONITORING, ED: Glucose-Capillary: 84 mg/dL (ref 70–99)

## 2020-06-30 MED ORDER — MECLIZINE HCL 25 MG PO TABS
25.0000 mg | ORAL_TABLET | Freq: Once | ORAL | Status: AC
  Administered 2020-06-30: 25 mg via ORAL
  Filled 2020-06-30: qty 1

## 2020-06-30 MED ORDER — SODIUM CHLORIDE 0.9 % IV BOLUS
1000.0000 mL | Freq: Once | INTRAVENOUS | Status: AC
  Administered 2020-06-30: 1000 mL via INTRAVENOUS

## 2020-06-30 MED ORDER — PROCHLORPERAZINE EDISYLATE 10 MG/2ML IJ SOLN
5.0000 mg | Freq: Once | INTRAMUSCULAR | Status: AC
  Administered 2020-06-30: 5 mg via INTRAVENOUS
  Filled 2020-06-30: qty 2

## 2020-06-30 MED ORDER — DIPHENHYDRAMINE HCL 50 MG/ML IJ SOLN
25.0000 mg | Freq: Once | INTRAMUSCULAR | Status: AC
  Administered 2020-06-30: 25 mg via INTRAVENOUS
  Filled 2020-06-30: qty 1

## 2020-06-30 MED ORDER — IOHEXOL 350 MG/ML SOLN
100.0000 mL | Freq: Once | INTRAVENOUS | Status: AC | PRN
  Administered 2020-06-30: 100 mL via INTRAVENOUS

## 2020-06-30 MED ORDER — MECLIZINE HCL 12.5 MG PO TABS: 12.5000 mg | ORAL_TABLET | Freq: Three times a day (TID) | ORAL | 0 refills | Status: DC | PRN

## 2020-06-30 MED ORDER — DEXAMETHASONE SODIUM PHOSPHATE 10 MG/ML IJ SOLN
10.0000 mg | Freq: Once | INTRAMUSCULAR | Status: AC
  Administered 2020-06-30: 10 mg via INTRAVENOUS
  Filled 2020-06-30: qty 1

## 2020-06-30 NOTE — ED Notes (Addendum)
Per RN Hansel Starling, will do orthostatic vitals at a later time.
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
## 2020-06-30 NOTE — Discharge Instructions (Addendum)

## 2020-06-30 NOTE — ED Notes (Addendum)
Pt reports HA & dizziness improved

## 2020-07-04 ENCOUNTER — Other Ambulatory Visit: Payer: Self-pay

## 2020-07-04 ENCOUNTER — Emergency Department (HOSPITAL_BASED_OUTPATIENT_CLINIC_OR_DEPARTMENT_OTHER): Payer: Medicare HMO

## 2020-07-04 ENCOUNTER — Observation Stay (HOSPITAL_BASED_OUTPATIENT_CLINIC_OR_DEPARTMENT_OTHER)
Admission: EM | Admit: 2020-07-04 | Discharge: 2020-07-10 | Disposition: A | Discharge status: Home / Self Care | Payer: Medicare HMO | Attending: Internal Medicine | Admitting: Internal Medicine

## 2020-07-04 ENCOUNTER — Encounter (HOSPITAL_BASED_OUTPATIENT_CLINIC_OR_DEPARTMENT_OTHER): Payer: Self-pay | Admitting: Radiology

## 2020-07-04 DIAGNOSIS — R29898 Other symptoms and signs involving the musculoskeletal system: Secondary | ICD-10-CM

## 2020-07-04 DIAGNOSIS — R778 Other specified abnormalities of plasma proteins: Secondary | ICD-10-CM | POA: Diagnosis not present

## 2020-07-04 DIAGNOSIS — R079 Chest pain, unspecified: Secondary | ICD-10-CM | POA: Diagnosis present

## 2020-07-04 DIAGNOSIS — I119 Hypertensive heart disease without heart failure: Secondary | ICD-10-CM | POA: Insufficient documentation

## 2020-07-04 DIAGNOSIS — R0789 Other chest pain: Secondary | ICD-10-CM | POA: Insufficient documentation

## 2020-07-04 DIAGNOSIS — I214 Non-ST elevation (NSTEMI) myocardial infarction: Secondary | ICD-10-CM | POA: Diagnosis not present

## 2020-07-04 DIAGNOSIS — I10 Essential (primary) hypertension: Secondary | ICD-10-CM | POA: Diagnosis not present

## 2020-07-04 DIAGNOSIS — R748 Abnormal levels of other serum enzymes: Secondary | ICD-10-CM | POA: Diagnosis not present

## 2020-07-04 DIAGNOSIS — J45909 Unspecified asthma, uncomplicated: Secondary | ICD-10-CM | POA: Insufficient documentation

## 2020-07-04 DIAGNOSIS — I517 Cardiomegaly: Secondary | ICD-10-CM | POA: Diagnosis not present

## 2020-07-04 DIAGNOSIS — R519 Headache, unspecified: Secondary | ICD-10-CM | POA: Diagnosis present

## 2020-07-04 DIAGNOSIS — Z20822 Contact with and (suspected) exposure to covid-19: Secondary | ICD-10-CM | POA: Insufficient documentation

## 2020-07-04 DIAGNOSIS — Z8709 Personal history of other diseases of the respiratory system: Secondary | ICD-10-CM

## 2020-07-04 DIAGNOSIS — Z91040 Latex allergy status: Secondary | ICD-10-CM | POA: Diagnosis not present

## 2020-07-04 DIAGNOSIS — R531 Weakness: Secondary | ICD-10-CM | POA: Diagnosis not present

## 2020-07-04 DIAGNOSIS — G8929 Other chronic pain: Principal | ICD-10-CM | POA: Diagnosis present

## 2020-07-04 DIAGNOSIS — R7989 Other specified abnormal findings of blood chemistry: Secondary | ICD-10-CM | POA: Diagnosis present

## 2020-07-04 DIAGNOSIS — M6281 Muscle weakness (generalized): Secondary | ICD-10-CM | POA: Diagnosis not present

## 2020-07-04 DIAGNOSIS — Z79899 Other long term (current) drug therapy: Secondary | ICD-10-CM | POA: Insufficient documentation

## 2020-07-04 LAB — CBC WITH DIFFERENTIAL/PLATELET
Abs Immature Granulocytes: 0.04 10*3/uL (ref 0.00–0.07)
Basophils Absolute: 0.1 10*3/uL (ref 0.0–0.1)
Basophils Relative: 1 %
Eosinophils Absolute: 0.1 10*3/uL (ref 0.0–0.5)
Eosinophils Relative: 1 %
HCT: 45.1 % (ref 36.0–46.0)
Hemoglobin: 14.2 g/dL (ref 12.0–15.0)
Immature Granulocytes: 0 %
Lymphocytes Relative: 21 %
Lymphs Abs: 2.5 10*3/uL (ref 0.7–4.0)
MCH: 26.3 pg (ref 26.0–34.0)
MCHC: 31.5 g/dL (ref 30.0–36.0)
MCV: 83.7 fL (ref 80.0–100.0)
Monocytes Absolute: 0.9 10*3/uL (ref 0.1–1.0)
Monocytes Relative: 8 %
Neutro Abs: 8.2 10*3/uL — ABNORMAL HIGH (ref 1.7–7.7)
Neutrophils Relative %: 69 %
Platelets: 409 10*3/uL — ABNORMAL HIGH (ref 150–400)
RBC: 5.39 MIL/uL — ABNORMAL HIGH (ref 3.87–5.11)
RDW: 15.2 % (ref 11.5–15.5)
WBC: 11.8 10*3/uL — ABNORMAL HIGH (ref 4.0–10.5)
nRBC: 0 % (ref 0.0–0.2)

## 2020-07-04 LAB — COMPREHENSIVE METABOLIC PANEL
ALT: 34 U/L (ref 0–44)
AST: 45 U/L — ABNORMAL HIGH (ref 15–41)
Albumin: 4 g/dL (ref 3.5–5.0)
Alkaline Phosphatase: 141 U/L — ABNORMAL HIGH (ref 38–126)
Anion gap: 11 (ref 5–15)
BUN: 14 mg/dL (ref 8–23)
CO2: 22 mmol/L (ref 22–32)
Calcium: 9.7 mg/dL (ref 8.9–10.3)
Chloride: 103 mmol/L (ref 98–111)
Creatinine, Ser: 1.06 mg/dL — ABNORMAL HIGH (ref 0.44–1.00)
GFR, Estimated: 59 mL/min — ABNORMAL LOW (ref >60)
Glucose, Bld: 98 mg/dL (ref 70–99)
Potassium: 3.8 mmol/L (ref 3.5–5.1)
Sodium: 136 mmol/L (ref 135–145)
Total Bilirubin: 0.2 mg/dL — ABNORMAL LOW (ref 0.3–1.2)
Total Protein: 9 g/dL — ABNORMAL HIGH (ref 6.5–8.1)

## 2020-07-04 LAB — CBC
HCT: 41.9 % (ref 36.0–46.0)
Hemoglobin: 13.3 g/dL (ref 12.0–15.0)
MCH: 26.6 pg (ref 26.0–34.0)
MCHC: 31.7 g/dL (ref 30.0–36.0)
MCV: 83.8 fL (ref 80.0–100.0)
Platelets: 416 10*3/uL — ABNORMAL HIGH (ref 150–400)
RBC: 5 MIL/uL (ref 3.87–5.11)
RDW: 15.2 % (ref 11.5–15.5)
WBC: 10.3 10*3/uL (ref 4.0–10.5)
nRBC: 0 % (ref 0.0–0.2)

## 2020-07-04 LAB — TROPONIN I (HIGH SENSITIVITY)
Troponin I (High Sensitivity): 27 ng/L — ABNORMAL HIGH (ref <18)
Troponin I (High Sensitivity): 69 ng/L — ABNORMAL HIGH (ref <18)
Troponin I (High Sensitivity): 196 ng/L (ref <18)
Troponin I (High Sensitivity): 310 ng/L (ref <18)

## 2020-07-04 LAB — RESP PANEL BY RT-PCR (FLU A&B, COVID) ARPGX2
Influenza A by PCR: NEGATIVE
Influenza B by PCR: NEGATIVE
SARS Coronavirus 2 by RT PCR: NEGATIVE

## 2020-07-04 LAB — CREATININE, SERUM
Creatinine, Ser: 0.98 mg/dL (ref 0.44–1.00)
GFR, Estimated: >60 mL/min (ref >60)

## 2020-07-04 MED ORDER — ACETAMINOPHEN 325 MG PO TABS
650.0000 mg | ORAL_TABLET | ORAL | Status: DC | PRN
  Filled 2020-07-04: qty 2

## 2020-07-04 MED ORDER — MOMETASONE FURO-FORMOTEROL FUM 100-5 MCG/ACT IN AERO
2.0000 | INHALATION_SPRAY | Freq: Two times a day (BID) | RESPIRATORY_TRACT | Status: DC
  Administered 2020-07-05 – 2020-07-09 (×8): 2 via RESPIRATORY_TRACT
  Filled 2020-07-04: qty 8.8

## 2020-07-04 MED ORDER — MONTELUKAST SODIUM 10 MG PO TABS
10.0000 mg | ORAL_TABLET | Freq: Every day | ORAL | Status: DC
  Administered 2020-07-05 – 2020-07-09 (×5): 10 mg via ORAL
  Filled 2020-07-04 (×5): qty 1

## 2020-07-04 MED ORDER — ALPRAZOLAM 0.25 MG PO TABS: 0.2500 mg | ORAL_TABLET | Freq: Every evening | ORAL | Status: DC | PRN

## 2020-07-04 MED ORDER — FAMOTIDINE 20 MG PO TABS
20.0000 mg | ORAL_TABLET | Freq: Every day | ORAL | Status: DC
  Administered 2020-07-05 – 2020-07-09 (×5): 20 mg via ORAL
  Filled 2020-07-04 (×5): qty 1

## 2020-07-04 MED ORDER — LORAZEPAM 2 MG/ML IJ SOLN
1.0000 mg | Freq: Once | INTRAMUSCULAR | Status: AC | PRN
  Administered 2020-07-05: 1 mg via INTRAVENOUS
  Filled 2020-07-04: qty 1

## 2020-07-04 MED ORDER — NITROGLYCERIN 0.4 MG SL SUBL
0.4000 mg | SUBLINGUAL_TABLET | SUBLINGUAL | Status: DC | PRN
  Administered 2020-07-04 (×2): 0.4 mg via SUBLINGUAL
  Filled 2020-07-04: qty 1

## 2020-07-04 MED ORDER — ENOXAPARIN SODIUM 40 MG/0.4ML ~~LOC~~ SOLN: 40.0000 mg | SUBCUTANEOUS | Status: DC

## 2020-07-04 MED ORDER — ALBUTEROL SULFATE HFA 108 (90 BASE) MCG/ACT IN AERS
2.0000 | INHALATION_SPRAY | Freq: Four times a day (QID) | RESPIRATORY_TRACT | Status: DC | PRN
  Filled 2020-07-04: qty 6.7

## 2020-07-04 MED ORDER — DOCUSATE SODIUM 100 MG PO CAPS
300.0000 mg | ORAL_CAPSULE | Freq: Every day | ORAL | Status: DC
  Administered 2020-07-05: 300 mg via ORAL
  Filled 2020-07-04: qty 3

## 2020-07-04 MED ORDER — SODIUM CHLORIDE 0.9 % IV SOLN: INTRAVENOUS | Status: DC | PRN

## 2020-07-04 MED ORDER — PREGABALIN 75 MG PO CAPS
75.0000 mg | ORAL_CAPSULE | Freq: Every day | ORAL | Status: DC
  Administered 2020-07-05 – 2020-07-09 (×5): 75 mg via ORAL
  Filled 2020-07-04: qty 3
  Filled 2020-07-04 (×3): qty 1

## 2020-07-04 MED ORDER — IOHEXOL 350 MG/ML SOLN
100.0000 mL | Freq: Once | INTRAVENOUS | Status: AC | PRN
  Administered 2020-07-04: 100 mL via INTRAVENOUS

## 2020-07-04 MED ORDER — ONDANSETRON HCL 4 MG/2ML IJ SOLN: 4.0000 mg | Freq: Four times a day (QID) | INTRAMUSCULAR | Status: DC | PRN

## 2020-07-04 MED ORDER — HEPARIN BOLUS VIA INFUSION: 4000.0000 [IU] | Freq: Once | INTRAVENOUS | Status: DC

## 2020-07-04 MED ORDER — DIPHENHYDRAMINE HCL 50 MG/ML IJ SOLN
50.0000 mg | Freq: Once | INTRAMUSCULAR | Status: AC
  Administered 2020-07-04: 50 mg via INTRAVENOUS
  Filled 2020-07-04: qty 1

## 2020-07-04 MED ORDER — HEPARIN (PORCINE) 25000 UT/250ML-% IV SOLN
1250.0000 [IU]/h | INTRAVENOUS | Status: DC
  Administered 2020-07-04: 1000 [IU]/h via INTRAVENOUS
  Filled 2020-07-04: qty 250

## 2020-07-04 MED ORDER — ONDANSETRON HCL 4 MG/2ML IJ SOLN
4.0000 mg | Freq: Four times a day (QID) | INTRAMUSCULAR | Status: DC | PRN
Start: 2020-07-04 — End: 2020-07-10

## 2020-07-04 MED ORDER — CLOPIDOGREL BISULFATE 300 MG PO TABS
300.0000 mg | ORAL_TABLET | Freq: Once | ORAL | Status: AC
  Administered 2020-07-04: 300 mg via ORAL
  Filled 2020-07-04: qty 1

## 2020-07-04 MED ORDER — MORPHINE SULFATE (PF) 2 MG/ML IV SOLN
2.0000 mg | INTRAVENOUS | Status: DC | PRN
  Administered 2020-07-07: 2 mg via INTRAVENOUS
  Filled 2020-07-04: qty 1

## 2020-07-04 MED ORDER — LORAZEPAM 2 MG/ML IJ SOLN: 0.5000 mg | Freq: Four times a day (QID) | INTRAMUSCULAR | Status: DC | PRN

## 2020-07-04 MED ORDER — LORAZEPAM 0.5 MG PO TABS
0.5000 mg | ORAL_TABLET | Freq: Four times a day (QID) | ORAL | Status: DC | PRN
  Administered 2020-07-06 – 2020-07-08 (×4): 0.5 mg via ORAL
  Filled 2020-07-04 (×4): qty 1

## 2020-07-04 MED ORDER — METOCLOPRAMIDE HCL 5 MG/ML IJ SOLN
10.0000 mg | Freq: Once | INTRAMUSCULAR | Status: AC
  Administered 2020-07-04: 10 mg via INTRAVENOUS
  Filled 2020-07-04: qty 2

## 2020-07-04 MED ORDER — FERROUS SULFATE 325 (65 FE) MG PO TABS
325.0000 mg | ORAL_TABLET | Freq: Three times a day (TID) | ORAL | Status: DC
  Administered 2020-07-05: 325 mg via ORAL
  Filled 2020-07-04 (×2): qty 1

## 2020-07-04 MED ORDER — LACTATED RINGERS IV SOLN: INTRAVENOUS | Status: DC

## 2020-07-04 NOTE — ED Notes (Signed)
Pt. Explaining to the Neurologist on Tele Neuro that she has taken Valium today 2mg  due to having an MRI.  Pt. Reports her Whole head hurts no like her usual Migraine.  Pt. Said by her being in the MRI machine the pounding has caused her whole head to hurt and she feels anxiety also and agreeing that she feels like she has a band around her head.  Pt. Agrees with Neurologist that she feels overwhelmed.

## 2020-07-04 NOTE — Progress Notes (Signed)
Pt presented to ED at [facility] w/ HA's since MRI earlier in the day. Also had chest pain and R sided weakness post MRI today.  Trop's were 27 then 69. Chest pain resolved in ED, sp sl ntg.  Cardiology called and did not recommend IV heparin. Neurology consulted for R sided weakness, resolved in ED.   CT head was negative and exam negative per the telehealth neurologist.  Pt is outside TPA window and CTA w/ without any LVO to their read. Neuro recommending MRI of brain noncontrast when she gets to [facility]. Pt will be transferred to [facility] for OBV admission, orders written. Call cardiology as needed Elsie Saas further, recur CP, etc).   Vinson Moselle, MD 07/04/2020, 6:58 PM

## 2020-07-04 NOTE — ED Notes (Signed)
Family updated as to patient's status.

## 2020-07-04 NOTE — ED Notes (Signed)
RN explained to the Tele Neuro Dr. Claiborne Billings the Pt. Just started complaining of the R leg heaviness and the R arm numbness and weakness.  Pt. Was not having any issues with R arm or R leg at time of initial assessment when RN first saw the Pt. With EDP.

## 2020-07-04 NOTE — ED Notes (Signed)
Attempted to get vitals, but pt is with imaging. Will get vitals upon return.

## 2020-07-04 NOTE — ED Notes (Signed)
Vital signs stable. 

## 2020-07-04 NOTE — ED Notes (Signed)
Paged Dr. Amada Jupiter

## 2020-07-04 NOTE — ED Notes (Signed)
Explained to Pt. We need another IV site to draw blood from due to the 22 gauge in her hand is not drawing.

## 2020-07-04 NOTE — Progress Notes (Addendum)
ANTICOAGULATION CONSULT NOTE - Initial Consult  Pharmacy Consult for heparin Indication: chest pain/ACS  Allergies  Allergen Reactions  . Acetaminophen Hives  . Aspirin Other (See Comments)    Per allergy test  . Chocolate Flavor Other (See Comments)    Rash in mouth  . Other Hives    Pain medication, pripoxyphone  . Pineapple Other (See Comments)    Rash in mouth  . Prunus Persica Other (See Comments)    Rash in mouth  . Sulfamethoxazole Hives  . Tomato Other (See Comments)    Rash in mouth  . Bee Venom Rash and Hives  . Covid-19 Mrna Vacc (Moderna) Rash  . Latex Rash and Hives  . Okra Rash  . Penicillins Rash    Patient Measurements: Height: 5\' 8"  (172.7 cm) Weight: 92.5 kg (204 lb) IBW/kg (Calculated) : 63.9 Heparin Dosing Weight: 83.7 kg   Vital Signs: Temp: 97.6 F (36.4 C) (04/14 1213) Temp Source: Oral (04/14 1213) BP: 118/61 (04/14 1838) Pulse Rate: 82 (04/14 1838)  Labs: Recent Labs    07/04/20 1328 07/04/20 1507 07/04/20 1732  HGB 14.2  --   --   HCT 45.1  --   --   PLT 409*  --   --   CREATININE 1.06*  --   --   TROPONINIHS 27* 69* 196*    Estimated Creatinine Clearance: 65.4 mL/min (A) (by C-G formula based on SCr of 1.06 mg/dL (H)).   Medical History: Past Medical History:  Diagnosis Date  . Allergic rhinitis   . Asthma   . Migraines     Medications:  Scheduled:    Assessment: 89 yof has been having CP and R sided weakness (CT negative, resolved) - no AC PTA.  Hgb 14.2, plt 409. Trop 68. No s/sx of bleeding. Confirmed with MD, neuro and cardiology okay with starting heparin.  Goal of Therapy:  Heparin level 0.3-0.7 units/ml Monitor platelets by anticoagulation protocol: Yes  Plan:  Given stroke work-up earlier, will hold on heparin bolus  Start heparin infusion at 1000 units/hr Check anti-Xa level in 6 hours and daily while on heparin Continue to monitor H&H and platelets  [id number], PharmD, BCCCP Clinical  Pharmacist  Phone: [phone] 07/04/2020 7:19 PM  Please check AMION for all [facility] phone numbers After 10:00 PM, call Main Pharmacy [phone]
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
## 2020-07-04 NOTE — ED Notes (Signed)
Tele Neuro on the computer with the Pt. At present time.  Pt. Is speaking clear sentences with the Dr.

## 2020-07-04 NOTE — ED Notes (Signed)
Pt. Placed on Bed Pan and was bending her R leg when she was on the bed pan.  Nothing said to the Pt. At that time.  Pt. Has been wiggling her toes the entire time.

## 2020-07-04 NOTE — ED Notes (Signed)
NEURO EXAM DONE ON Pt.  Pt. Speaking full sentences at this time with no issues speaking full sentences and no facial droop.  Pt. Does have unequal grip and less time holding the R arm up and less sensation in the R leg than the L and can't hold the R leg up due to a feeling of heaviness in the R leg.

## 2020-07-04 NOTE — ED Notes (Signed)
Pt. Asked for 5 more minutes before we give more NTG for the chest pressure.  Pt. Said the pressure is getting better.

## 2020-07-04 NOTE — ED Notes (Signed)
ED Provider at bedside. 
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
## 2020-07-05 ENCOUNTER — Observation Stay (HOSPITAL_BASED_OUTPATIENT_CLINIC_OR_DEPARTMENT_OTHER): Payer: Medicare HMO

## 2020-07-05 ENCOUNTER — Inpatient Hospital Stay (HOSPITAL_COMMUNITY): Payer: Medicare HMO

## 2020-07-05 ENCOUNTER — Observation Stay (HOSPITAL_COMMUNITY): Payer: Medicare HMO

## 2020-07-05 DIAGNOSIS — Z8709 Personal history of other diseases of the respiratory system: Secondary | ICD-10-CM | POA: Diagnosis not present

## 2020-07-05 DIAGNOSIS — G894 Chronic pain syndrome: Secondary | ICD-10-CM

## 2020-07-05 DIAGNOSIS — I214 Non-ST elevation (NSTEMI) myocardial infarction: Secondary | ICD-10-CM

## 2020-07-05 DIAGNOSIS — R778 Other specified abnormalities of plasma proteins: Secondary | ICD-10-CM

## 2020-07-05 DIAGNOSIS — R531 Weakness: Secondary | ICD-10-CM

## 2020-07-05 DIAGNOSIS — R0789 Other chest pain: Secondary | ICD-10-CM | POA: Diagnosis not present

## 2020-07-05 DIAGNOSIS — M6281 Muscle weakness (generalized): Secondary | ICD-10-CM | POA: Diagnosis not present

## 2020-07-05 DIAGNOSIS — R079 Chest pain, unspecified: Secondary | ICD-10-CM

## 2020-07-05 DIAGNOSIS — R748 Abnormal levels of other serum enzymes: Secondary | ICD-10-CM | POA: Diagnosis not present

## 2020-07-05 DIAGNOSIS — R519 Headache, unspecified: Secondary | ICD-10-CM | POA: Diagnosis not present

## 2020-07-05 DIAGNOSIS — G8929 Other chronic pain: Secondary | ICD-10-CM | POA: Diagnosis not present

## 2020-07-05 DIAGNOSIS — Z20822 Contact with and (suspected) exposure to covid-19: Secondary | ICD-10-CM | POA: Diagnosis not present

## 2020-07-05 LAB — ECHOCARDIOGRAM COMPLETE
AR max vel: 2.19 cm2
AV Area VTI: 2.08 cm2
AV Area mean vel: 2.07 cm2
AV Mean grad: 3 mmHg
AV Peak grad: 5.4 mmHg
Ao pk vel: 1.16 m/s
Area-P 1/2: 3.53 cm2
Calc EF: 50.1 %
Height: 68 in
S' Lateral: 2.6 cm
Single Plane A2C EF: 45.1 %
Single Plane A4C EF: 52.5 %
Weight: 3264 oz

## 2020-07-05 LAB — LIPID PANEL
Cholesterol: 167 mg/dL (ref 0–200)
HDL: 44 mg/dL (ref >40)
LDL Cholesterol: 108 mg/dL — ABNORMAL HIGH (ref 0–99)
Total CHOL/HDL Ratio: 3.8 RATIO
Triglycerides: 77 mg/dL (ref <150)
VLDL: 15 mg/dL (ref 0–40)

## 2020-07-05 LAB — VITAMIN B12: Vitamin B-12: 203 pg/mL (ref 180–914)

## 2020-07-05 LAB — HIV ANTIBODY (ROUTINE TESTING W REFLEX): HIV Screen 4th Generation wRfx: NONREACTIVE

## 2020-07-05 LAB — TROPONIN I (HIGH SENSITIVITY): Troponin I (High Sensitivity): 130 ng/L (ref <18)

## 2020-07-05 LAB — HEMOGLOBIN A1C
Hgb A1c MFr Bld: 5.9 % — ABNORMAL HIGH (ref 4.8–5.6)
Mean Plasma Glucose: 122.63 mg/dL

## 2020-07-05 LAB — HEPARIN LEVEL (UNFRACTIONATED)
Heparin Unfractionated: 0.15 IU/mL — ABNORMAL LOW (ref 0.30–0.70)
Heparin Unfractionated: 0.43 IU/mL (ref 0.30–0.70)

## 2020-07-05 LAB — TSH: TSH: 1.609 u[IU]/mL (ref 0.350–4.500)

## 2020-07-05 MED ORDER — NITROGLYCERIN 0.4 MG SL SUBL
SUBLINGUAL_TABLET | SUBLINGUAL | Status: AC
  Filled 2020-07-05: qty 2

## 2020-07-05 MED ORDER — ATORVASTATIN CALCIUM 80 MG PO TABS
80.0000 mg | ORAL_TABLET | Freq: Every day | ORAL | Status: DC
  Administered 2020-07-05 – 2020-07-09 (×6): 80 mg via ORAL
  Filled 2020-07-05 (×6): qty 1

## 2020-07-05 MED ORDER — IOHEXOL 350 MG/ML SOLN
100.0000 mL | Freq: Once | INTRAVENOUS | Status: AC | PRN
  Administered 2020-07-05: 95 mL via INTRAVENOUS

## 2020-07-05 MED ORDER — LOSARTAN POTASSIUM 25 MG PO TABS
25.0000 mg | ORAL_TABLET | Freq: Every day | ORAL | Status: DC
  Administered 2020-07-05 – 2020-07-09 (×5): 25 mg via ORAL
  Filled 2020-07-05 (×5): qty 1

## 2020-07-05 MED ORDER — METOPROLOL TARTRATE 5 MG/5ML IV SOLN
INTRAVENOUS | Status: AC
  Filled 2020-07-05: qty 10

## 2020-07-05 MED ORDER — FERROUS SULFATE 325 (65 FE) MG PO TABS
325.0000 mg | ORAL_TABLET | Freq: Every day | ORAL | Status: DC
  Administered 2020-07-06 – 2020-07-09 (×4): 325 mg via ORAL
  Filled 2020-07-05 (×4): qty 1

## 2020-07-05 MED ORDER — METOPROLOL TARTRATE 25 MG PO TABS
25.0000 mg | ORAL_TABLET | Freq: Two times a day (BID) | ORAL | Status: DC
  Administered 2020-07-05 – 2020-07-09 (×11): 25 mg via ORAL
  Filled 2020-07-05 (×11): qty 1

## 2020-07-05 MED ORDER — DOCUSATE SODIUM 100 MG PO CAPS
300.0000 mg | ORAL_CAPSULE | Freq: Every day | ORAL | Status: DC
  Administered 2020-07-06 – 2020-07-09 (×3): 300 mg via ORAL
  Filled 2020-07-05 (×4): qty 3

## 2020-07-05 MED ORDER — PERFLUTREN LIPID MICROSPHERE
1.0000 mL | INTRAVENOUS | Status: DC | PRN
Start: 2020-07-05 — End: 2020-07-05
  Administered 2020-07-05: 1.5 mL via INTRAVENOUS
  Filled 2020-07-05: qty 10

## 2020-07-05 NOTE — Progress Notes (Signed)
During the shift patient has been seen using her right hand to eat and moving it purposefully at times. On assessment there was inconsistent results with movement and sensation during shift as well. NT has reported patient picking up chicken with right hand to eat it and other observations when patient was using her right hand during shift.

## 2020-07-05 NOTE — Progress Notes (Incomplete)
Echocardiogram with Definity completed. Felt that Definity was needed to find out if there was an apical clot due to apical akinesis. Contraindication was stated at the time of ordering post delivery.
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
## 2020-07-05 NOTE — Progress Notes (Signed)
ANTICOAGULATION CONSULT NOTE  Pharmacy Consult for heparin Indication: chest pain/ACS  Allergies  Allergen Reactions  . Acetaminophen Hives  . Aspirin Other (See Comments)    Per allergy test  . Chocolate Flavor Other (See Comments)    Rash in mouth  . Other Hives    Pain medication, pripoxyphone  . Pineapple Other (See Comments)    Rash in mouth  . Prunus Persica Other (See Comments)    Rash in mouth  . Sulfamethoxazole Hives  . Tomato Other (See Comments)    Rash in mouth  . Bee Venom Rash and Hives  . Covid-19 Mrna Vacc (Moderna) Rash  . Latex Rash and Hives  . Okra Rash  . Penicillins Rash    Patient Measurements: Height: 5\' 8"  (172.7 cm) Weight: 92.5 kg (204 lb) IBW/kg (Calculated) : 63.9 Heparin Dosing Weight: 83.7 kg   Vital Signs: Temp: 98.2 F (36.8 C) (04/15 0043) Temp Source: Oral (04/15 0043) BP: 133/86 (04/15 0043) Pulse Rate: 97 (04/15 0149)  Labs: Recent Labs    07/04/20 1328 07/04/20 1507 07/04/20 1732 07/04/20 1945 07/04/20 2142 07/05/20 0221  HGB 14.2  --   --   --  13.3  --   HCT 45.1  --   --   --  41.9  --   PLT 409*  --   --   --  416*  --   HEPARINUNFRC  --   --   --   --   --  0.15*  CREATININE 1.06*  --   --   --  0.98  --   TROPONINIHS 27* 69* 196* 310*  --   --     Estimated Creatinine Clearance: 70.8 mL/min (by C-G formula based on SCr of 0.98 mg/dL).    Assessment: 16 yof has been having CP and R sided weakness (CT negative, resolved) - no AC PTA. Heparin per pharmacy consult.  Heparin level subtherapeutic (0.15) on gtt at 1000 units/hr. No issues with line or bleeding reported per RN.  Goal of Therapy:  Heparin level 0.3-0.7 units/ml Monitor platelets by anticoagulation protocol: Yes  Plan:  Increase heparin infusion to 1250 units/hr Heparin level in 6 hours  66, PharmD, BCPS Please see amion for complete clinical pharmacist phone list 07/05/2020 3:04 AM

## 2020-07-05 NOTE — Progress Notes (Signed)
Brief Neurology Update  Pt by Dr. Derry Lory earlier today by telestroke from [facility] for R sided weakness. Exam was favored to be functional and MRI brain was normal. No further inpatient neurologic w/u indicated. Pt undergoing w/u for elevated troponins.   Neurology to sign off, but please re-engage if additional questions arise.  Bing Neighbors, MD [facility] [phone]  If 7pm- 7am, please page neurology on call as listed in AMION.
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
## 2020-07-05 NOTE — Progress Notes (Addendum)
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  Time spent: Greater than 50% of the 35 minute visit was spent in counseling/coordination of care for the patient as laid out in the A&P.   Lewie Chamber, MD [facility] 07/05/2020, 2:01 PM

## 2020-07-05 NOTE — Progress Notes (Addendum)
ANTICOAGULATION CONSULT NOTE  Pharmacy Consult for heparin Indication: chest pain/ACS  Allergies  Allergen Reactions  . Acetaminophen Hives  . Aspirin Other (See Comments)    Per allergy test  . Chocolate Flavor Other (See Comments)    Rash in mouth  . Other Hives    Pain medication, pripoxyphone  . Pineapple Other (See Comments)    Rash in mouth  . Prunus Persica Other (See Comments)    Rash in mouth  . Sulfamethoxazole Hives  . Tomato Other (See Comments)    Rash in mouth  . Bee Venom Rash and Hives  . Covid-19 Mrna Vacc (Moderna) Rash  . Latex Rash and Hives  . Okra Rash  . Penicillins Rash    Patient Measurements: Height: 5\' 8"  (172.7 cm) Weight: 92.5 kg (204 lb) IBW/kg (Calculated) : 63.9 Heparin Dosing Weight: 83.7 kg   Vital Signs: Temp: 98.7 F (37.1 C) (04/15 0951) Temp Source: Oral (04/15 0951) BP: 122/66 (04/15 0951) Pulse Rate: 78 (04/15 0951)  Labs: Recent Labs    07/04/20 1328 07/04/20 1507 07/04/20 1732 07/04/20 1945 07/04/20 2142 07/05/20 0221 07/05/20 0609 07/05/20 0922  HGB 14.2  --   --   --  13.3  --   --   --   HCT 45.1  --   --   --  41.9  --   --   --   PLT 409*  --   --   --  416*  --   --   --   HEPARINUNFRC  --   --   --   --   --  0.15*  --  0.43  CREATININE 1.06*  --   --   --  0.98  --   --   --   TROPONINIHS 27*   < > 196* 310*  --   --  130*  --    < > = values in this interval not displayed.    Estimated Creatinine Clearance: 70.8 mL/min (by C-G formula based on SCr of 0.98 mg/dL).    Assessment: 22 yof has been having CP and R sided weakness (CT negative and MRI) - no AC PTA. Heparin per pharmacy consult. Plans noted for cardiac CTA -heparin level at goal, hg= 13.3   Goal of Therapy:  Heparin level= 0.3-0.5 Monitor platelets by anticoagulation protocol: Yes  Plan:  -Continue heparin at 1250 units/hr -recheck heparin level today -Daily heparin level and CBC  68, PharmD Clinical  Pharmacist **Pharmacist phone directory can now be found on [email or web address] ([email or web address]).  Listed under [facility].
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
## 2020-07-06 DIAGNOSIS — G43909 Migraine, unspecified, not intractable, without status migrainosus: Secondary | ICD-10-CM | POA: Diagnosis not present

## 2020-07-06 DIAGNOSIS — R079 Chest pain, unspecified: Secondary | ICD-10-CM | POA: Diagnosis not present

## 2020-07-06 DIAGNOSIS — F449 Dissociative and conversion disorder, unspecified: Secondary | ICD-10-CM

## 2020-07-06 DIAGNOSIS — G894 Chronic pain syndrome: Secondary | ICD-10-CM | POA: Diagnosis not present

## 2020-07-06 LAB — CBC WITH DIFFERENTIAL/PLATELET
Abs Immature Granulocytes: 0.04 10*3/uL (ref 0.00–0.07)
Basophils Absolute: 0.1 10*3/uL (ref 0.0–0.1)
Basophils Relative: 1 %
Eosinophils Absolute: 0.1 10*3/uL (ref 0.0–0.5)
Eosinophils Relative: 1 %
HCT: 37.4 % (ref 36.0–46.0)
Hemoglobin: 11.9 g/dL — ABNORMAL LOW (ref 12.0–15.0)
Immature Granulocytes: 0 %
Lymphocytes Relative: 35 %
Lymphs Abs: 3.3 10*3/uL (ref 0.7–4.0)
MCH: 26.3 pg (ref 26.0–34.0)
MCHC: 31.8 g/dL (ref 30.0–36.0)
MCV: 82.6 fL (ref 80.0–100.0)
Monocytes Absolute: 1.2 10*3/uL — ABNORMAL HIGH (ref 0.1–1.0)
Monocytes Relative: 12 %
Neutro Abs: 4.8 10*3/uL (ref 1.7–7.7)
Neutrophils Relative %: 51 %
Platelets: 355 10*3/uL (ref 150–400)
RBC: 4.53 MIL/uL (ref 3.87–5.11)
RDW: 15.3 % (ref 11.5–15.5)
WBC: 9.5 10*3/uL (ref 4.0–10.5)
nRBC: 0 % (ref 0.0–0.2)

## 2020-07-06 LAB — BASIC METABOLIC PANEL
Anion gap: 7 (ref 5–15)
BUN: 12 mg/dL (ref 8–23)
CO2: 22 mmol/L (ref 22–32)
Calcium: 8.9 mg/dL (ref 8.9–10.3)
Chloride: 110 mmol/L (ref 98–111)
Creatinine, Ser: 1.13 mg/dL — ABNORMAL HIGH (ref 0.44–1.00)
GFR, Estimated: 55 mL/min — ABNORMAL LOW (ref >60)
Glucose, Bld: 112 mg/dL — ABNORMAL HIGH (ref 70–99)
Potassium: 3.9 mmol/L (ref 3.5–5.1)
Sodium: 139 mmol/L (ref 135–145)

## 2020-07-06 LAB — MAGNESIUM: Magnesium: 1.9 mg/dL (ref 1.7–2.4)

## 2020-07-06 MED ORDER — VITAMIN B-12 1000 MCG PO TABS: 1000.0000 ug | ORAL_TABLET | Freq: Every day | ORAL | 5 refills | Status: AC

## 2020-07-06 MED ORDER — ENOXAPARIN SODIUM 40 MG/0.4ML ~~LOC~~ SOLN
40.0000 mg | SUBCUTANEOUS | Status: DC
  Administered 2020-07-06 – 2020-07-08 (×3): 40 mg via SUBCUTANEOUS
  Filled 2020-07-06 (×4): qty 0.4

## 2020-07-06 MED ORDER — CYANOCOBALAMIN 1000 MCG/ML IJ SOLN
1000.0000 ug | Freq: Once | INTRAMUSCULAR | Status: AC
  Administered 2020-07-06: 1000 ug via INTRAMUSCULAR
  Filled 2020-07-06: qty 1

## 2020-07-06 NOTE — Evaluation (Signed)
Occupational Therapy Evaluation Patient Details Name: Joyce Fox MRN: [id number] DOB: Dec 04, 1960 Today's Date: 07/06/2020    History of Present Illness This 60 y.o. female admitted with back pain, HA, and Rt sided weakness.  MRI no acute infarct/abnormalities.  Dx: conversion disorder.  PMH includes:  Migraines, asthma, s/p Rt RCR   Clinical Impression   Pt admitted with above. She demonstrates the below listed deficits and will benefit from continued OT to maximize safety and independence with BADLs.  Pt presents to OT with perceived Rt sided weakness, however, pt moved Rt UE and LE spontaneously and inconsistently throughout session.  However, when asked to move her Rt side, she said she couldn't.  When asked to perform bil. Tasks, she demonstrated decreased Lt UE movement during functional tasks too.  She required set up - max A for ADLs and was unable to perform transfer with OT.  She reports she lives with her spouse, who works during the day, and was fully independent PTA.  She reports that a w/c will not fit through their home, and that spouse is not home during the day to assist her.  Based on her presentation this date, she will require 24 hour assist at discharge, therefore SNF recommended.  Will follow acutely.       Follow Up Recommendations  SNF    Equipment Recommendations  3 in 1 bedside commode    Recommendations for Other Services       Precautions / Restrictions Precautions Precautions: Fall      Mobility Bed Mobility Overal bed mobility: Needs Assistance Bed Mobility: Rolling;Sidelying to Sit;Sit to Supine Rolling: Mod assist Sidelying to sit: Mod assist   Sit to supine: Mod assist   General bed mobility comments: when asked to roll to the Lt, pt reached across her body with Rt UE to reach for bed rail, but failed to roll and stated she couldn't move.  Pt required assist to flex Rt knee, and verbal cues given to reach for rail, then to roll to the Lt. -  assist provided at Rt hip.  She required assist to move LEs off the EOB, and to initiate pushing herself up using her Lt UE (initially stated she couldn't sit up).  When returning to supine, she required assist to lift LEs onto the bed    Transfers Overall transfer level: Needs assistance               General transfer comment: Attempted to to stand, but pt unable to achieve - very poor effort.  Assisted pt with scooting up the EOB.  She required step by step cues, and on the second attempt, spontaneously moved Rt LE actively to the Lt, and stoodk 3/4 of the way without awareness that she had achieved near standing    Balance Overall balance assessment: Needs assistance Sitting-balance support: Feet supported;No upper extremity supported;Single extremity supported Sitting balance-Leahy Scale: Fair Sitting balance - Comments: Pt rocks Lt and Rt, but able to maintain static sitting with min guard assist                                   ADL either performed or assessed with clinical judgement   ADL Overall ADL's : Needs assistance/impaired Eating/Feeding: Set up;Bed level   Grooming: Wash/dry hands;Wash/dry face;Oral care;Set up;Bed level   Upper Body Bathing: Moderate assistance;Sitting   Lower Body Bathing: Maximal assistance;Sitting/lateral leans;Bed level  Upper Body Dressing : Maximal assistance;Sitting   Lower Body Dressing: Maximal assistance;Sit to/from stand   Toilet Transfer: Total assistance Toilet Transfer Details (indicate cue type and reason): unable Toileting- Clothing Manipulation and Hygiene: Total assistance;Bed level       Functional mobility during ADLs: Maximal assistance       Vision Baseline Vision/History: Wears glasses Wears Glasses: At all times Patient Visual Report: No change from baseline       Perception     Praxis      Pertinent Vitals/Pain Pain Assessment: Faces Faces Pain Scale: No hurt     Hand Dominance  Right   Extremity/Trunk Assessment Upper Extremity Assessment Upper Extremity Assessment: Generalized weakness;RUE deficits/detail;LUE deficits/detail RUE Deficits / Details: Rt UE  Pt with inconsistencies throughout session.  She indicates she can't move it, but spontaneously picked up her call button and moved it to the side x 2.  During AAROM, she was noted to assist spontaneously, then would drop it to her side.  She was able to pull sock up onto Lt foot with her Rt hand, then dropped it to her side LUE Deficits / Details: AROM grossly WFL, but inconsistencies noted throughout session.  She would spontaneously lift it, but when engaged in tasks that should require bil. UE movement, Lt UE movement slow and deliberate and at times very limited   Lower Extremity Assessment Lower Extremity Assessment: Defer to PT evaluation   Cervical / Trunk Assessment Cervical / Trunk Assessment: Other exceptions Cervical / Trunk Exceptions: pt keeps head/neck flexed while seated EOB   Communication Communication Communication: No difficulties   Cognition Arousal/Alertness: Awake/alert Behavior During Therapy: Flat affect Overall Cognitive Status: Within Functional Limits for tasks assessed                                 General Comments: grossly   General Comments  VSS.  Discussed option of using w/c in the home at discharge, but she says this isn't an option. She initially stated the w/c does not fit through the house, then said "even if it did fit, my husband wouldn't be home to take care of me"    Exercises     Shoulder Instructions      Home Living Family/patient expects to be discharged to:: Private residence Living Arrangements: Spouse/significant other Available Help at Discharge: Family;Available 24 hours/day Type of Home: House Home Access: Stairs to enter [employer] of Steps: 4> porch>1 step into house Entrance Stairs-Rails: Left;Right Home Layout: One  level     Bathroom Shower/Tub: Tub/shower unit;Door   [employer] Toilet: Standard     Home Equipment: Environmental consultant - 2 wheels;Wheelchair - manual;Cane - single point   Additional Comments: Pt reports DME is her spouse's and is left over from when he had a massive stroke      Prior Functioning/Environment Level of Independence: Independent        Comments: Pt reports she has not driven since onset of headaches.  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        OT Problem List: Decreased strength;Decreased range of motion;Decreased activity tolerance;Impaired balance (sitting and/or standing);Decreased coordination;Decreased safety awareness;Decreased knowledge of use of DME or AE;Impaired UE functional use      OT Treatment/Interventions: Self-care/ADL training;Therapeutic exercise;DME and/or AE instruction;Therapeutic activities;Patient/family education;Balance training    OT Goals(Current goals can be found in the care plan section) Acute Rehab OT Goals Patient Stated  Goal: I want to be able to take of myself OT Goal Formulation: With patient Time For Goal Achievement: 07/06/20 Potential to Achieve Goals: Good ADL Goals Pt Will Perform Grooming: with set-up;sitting Pt Will Perform Upper Body Bathing: with supervision;with set-up;sitting Pt Will Perform Lower Body Bathing: with supervision;sit to/from stand Pt Will Perform Upper Body Dressing: with set-up;sitting Pt Will Perform Lower Body Dressing: with supervision;sit to/from stand Pt Will Transfer to Toilet: with supervision;ambulating;regular height toilet;bedside commode;grab bars Pt Will Perform Toileting - Clothing Manipulation and hygiene: with supervision;sit to/from stand  OT Frequency: Min 2X/week   Barriers to D/C: Decreased caregiver support          Co-evaluation              AM-PAC OT "6 Clicks" Daily Activity     Outcome Measure Help from another person eating meals?: A Little Help from another  person taking care of personal grooming?: A Little Help from another person toileting, which includes using toliet, bedpan, or urinal?: Total Help from another person bathing (including washing, rinsing, drying)?: A Lot Help from another person to put on and taking off regular upper body clothing?: A Lot Help from another person to put on and taking off regular lower body clothing?: Total 6 Click Score: 12   End of Session Equipment Utilized During Treatment: Gait belt;Rolling walker Nurse Communication: Mobility status  Activity Tolerance: Patient tolerated treatment well Patient left: in bed  OT Visit Diagnosis: Unsteadiness on feet (R26.81);Hemiplegia and hemiparesis Hemiplegia - Right/Left: Right Hemiplegia - dominant/non-dominant: Dominant                Time: 9147-8295 OT Time Calculation (min): 28 min Charges:  OT General Charges $OT Visit: 1 Visit OT Evaluation $OT Eval Moderate Complexity: 1 Mod OT Treatments $Therapeutic Activity: 8-22 mins  Eber Jones., OTR/L Acute Rehabilitation Services Pager [phone] Office [phone]   Jeani Hawking M 07/06/2020, 3:17 PM
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
## 2020-07-06 NOTE — Progress Notes (Signed)
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 Electronically signed by Jodelle RedBridgette Christopher MD Signature Date/Time: 07/05/2020/12:14:40 PM    Final    CT CORONARY MORPH W/CTA COR W/SCORE Vallarie MareW/CA W/CM &/OR WO/CM  Final Result  Addendum 1 of 1  ADDENDUM REPORT: 07/05/2020 14:07    HISTORY:  60 yo female with acute coronary syndrome (ACS)/MI, complication  suspected    EXAM:  Cardiac/Coronary CTA    TECHNIQUE:  The patient was scanned on a [employer] Force scanner.    PROTOCOL:  A 120 kV prospective scan was triggered in the descending thoracic  aorta at 111 HU's. Axial non-contrast 3 mm slices were carried out  through the heart. The data set was analyzed on a dedicated work  station and scored using the Agatson method. Gantry rotation speed  was 250 msecs and collimation was .6 mm. Beta blockade and 0.8 mg of  sl NTG was given. The 3D data set was reconstructed in 5% intervals  of the 67-82 % of the R-R cycle. Diastolic phases were analyzed on a  dedicated work station  using MPR, MIP and VRT modes. The patient  received 95mL OMNIPAQUE IOHEXOL 350 MG/ML SOLN of contrast.    FINDINGS:  Quality: Excellent, HR 69    Coronary calcium score: The patient's coronary artery calcium score  is 0, which places the patient in the 0 percentile.    Coronary arteries: Normal coronary origins.  Right dominance.    Right Coronary Artery: Dominant. Normal artery. Normal R-PDA and  tortuous, but normal R-PLB branches.    Left Main Coronary Artery: Normal. Bifurcates into the LAD and LCX  arteries.    Left Anterior Descending Coronary Artery: Large anterior vessel  without disease. Large high D1 branch without disease.    Left Circumflex Artery: AV groove LCx vessel without disease.    Aorta: Normal size, 31 mm at the mid ascending aorta (level of the  PA bifurcation) measured double oblique. No calcifications. No  dissection.    Aortic Valve: Trileaflet.  No calcifications.    Other findings:    Normal pulmonary vein drainage into the left atrium.    Normal left atrial appendage without a thrombus.    Normal size of the pulmonary artery.    IMPRESSION:  1. No evidence of CAD, CADRADS = 0.    2. Coronary calcium score of 0. This was 0 percentile for age and  sex matched control.    3. Normal coronary origin  with right dominance.      Electronically Signed    By: Chrystie Nose M.D.    On: 07/05/2020 14:07      Final    MR BRAIN WO CONTRAST  Final Result    CT ANGIO HEAD NECK W WO CM  Final Result    CT Head Wo Contrast  Final Result    DG Chest Port 1 View  Final Result      Scheduled Meds: . atorvastatin  80 mg Oral QHS  . docusate sodium  300 mg Oral Q breakfast  . famotidine  20 mg Oral Daily  . ferrous sulfate  325 mg Oral Q breakfast  . losartan  25 mg Oral Daily  . metoprolol tartrate  25 mg Oral BID  . mometasone-formoterol  2 puff Inhalation BID  . montelukast  10 mg Oral QHS  . pregabalin  75 mg Oral Daily   PRN  Meds: sodium chloride, acetaminophen, albuterol, LORazepam, LORazepam, morphine injection, ondansetron (ZOFRAN) IV Continuous Infusions: . sodium chloride 10 mL/hr at 07/04/20 1953     LOS: 1 day  Time spent: Greater than 50% of the 35 minute visit was spent in counseling/coordination of care for the patient as laid out in the A&P.   Lewie Chamber, MD [facility] 07/06/2020, 2:48 PM

## 2020-07-07 DIAGNOSIS — R079 Chest pain, unspecified: Secondary | ICD-10-CM | POA: Diagnosis not present

## 2020-07-07 DIAGNOSIS — R519 Headache, unspecified: Secondary | ICD-10-CM | POA: Diagnosis not present

## 2020-07-07 DIAGNOSIS — R778 Other specified abnormalities of plasma proteins: Secondary | ICD-10-CM | POA: Diagnosis not present

## 2020-07-07 DIAGNOSIS — R0789 Other chest pain: Secondary | ICD-10-CM | POA: Diagnosis not present

## 2020-07-07 DIAGNOSIS — M6281 Muscle weakness (generalized): Secondary | ICD-10-CM | POA: Diagnosis not present

## 2020-07-07 DIAGNOSIS — R748 Abnormal levels of other serum enzymes: Secondary | ICD-10-CM | POA: Diagnosis not present

## 2020-07-07 DIAGNOSIS — Z20822 Contact with and (suspected) exposure to covid-19: Secondary | ICD-10-CM | POA: Diagnosis not present

## 2020-07-07 DIAGNOSIS — G8929 Other chronic pain: Secondary | ICD-10-CM | POA: Diagnosis not present

## 2020-07-07 LAB — CBC WITH DIFFERENTIAL/PLATELET
Abs Immature Granulocytes: 0.04 10*3/uL (ref 0.00–0.07)
Basophils Absolute: 0 10*3/uL (ref 0.0–0.1)
Basophils Relative: 0 %
Eosinophils Absolute: 0.2 10*3/uL (ref 0.0–0.5)
Eosinophils Relative: 1 %
HCT: 37.2 % (ref 36.0–46.0)
Hemoglobin: 11.7 g/dL — ABNORMAL LOW (ref 12.0–15.0)
Immature Granulocytes: 0 %
Lymphocytes Relative: 29 %
Lymphs Abs: 3.5 10*3/uL (ref 0.7–4.0)
MCH: 26.7 pg (ref 26.0–34.0)
MCHC: 31.5 g/dL (ref 30.0–36.0)
MCV: 84.7 fL (ref 80.0–100.0)
Monocytes Absolute: 1.1 10*3/uL — ABNORMAL HIGH (ref 0.1–1.0)
Monocytes Relative: 9 %
Neutro Abs: 7.4 10*3/uL (ref 1.7–7.7)
Neutrophils Relative %: 61 %
Platelets: 348 10*3/uL (ref 150–400)
RBC: 4.39 MIL/uL (ref 3.87–5.11)
RDW: 15.4 % (ref 11.5–15.5)
WBC: 12.3 10*3/uL — ABNORMAL HIGH (ref 4.0–10.5)
nRBC: 0 % (ref 0.0–0.2)

## 2020-07-07 LAB — BASIC METABOLIC PANEL
Anion gap: 7 (ref 5–15)
BUN: 11 mg/dL (ref 8–23)
CO2: 20 mmol/L — ABNORMAL LOW (ref 22–32)
Calcium: 9 mg/dL (ref 8.9–10.3)
Chloride: 111 mmol/L (ref 98–111)
Creatinine, Ser: 1 mg/dL (ref 0.44–1.00)
GFR, Estimated: >60 mL/min (ref >60)
Glucose, Bld: 104 mg/dL — ABNORMAL HIGH (ref 70–99)
Potassium: 4 mmol/L (ref 3.5–5.1)
Sodium: 138 mmol/L (ref 135–145)

## 2020-07-07 LAB — MAGNESIUM: Magnesium: 1.8 mg/dL (ref 1.7–2.4)

## 2020-07-07 MED ORDER — CYANOCOBALAMIN 1000 MCG/ML IJ SOLN
1000.0000 ug | Freq: Every day | INTRAMUSCULAR | Status: DC
  Administered 2020-07-07 – 2020-07-09 (×3): 1000 ug via INTRAMUSCULAR
  Filled 2020-07-07 (×3): qty 1

## 2020-07-07 MED ORDER — ADULT MULTIVITAMIN W/MINERALS CH
1.0000 | ORAL_TABLET | Freq: Every day | ORAL | Status: DC
  Administered 2020-07-07 – 2020-07-09 (×3): 1 via ORAL
  Filled 2020-07-07 (×3): qty 1

## 2020-07-07 MED ORDER — TOPIRAMATE 25 MG PO TABS
50.0000 mg | ORAL_TABLET | Freq: Two times a day (BID) | ORAL | Status: DC
  Administered 2020-07-07 – 2020-07-09 (×6): 50 mg via ORAL
  Filled 2020-07-07 (×7): qty 2

## 2020-07-07 MED ORDER — METOCLOPRAMIDE HCL 5 MG/ML IJ SOLN
10.0000 mg | Freq: Four times a day (QID) | INTRAMUSCULAR | Status: DC | PRN
  Administered 2020-07-07 – 2020-07-09 (×2): 10 mg via INTRAVENOUS
  Filled 2020-07-07 (×2): qty 2

## 2020-07-07 MED ORDER — DIPHENHYDRAMINE HCL 50 MG/ML IJ SOLN
25.0000 mg | Freq: Four times a day (QID) | INTRAMUSCULAR | Status: DC | PRN
  Administered 2020-07-07 – 2020-07-09 (×2): 25 mg via INTRAVENOUS
  Filled 2020-07-07 (×2): qty 1

## 2020-07-07 NOTE — Progress Notes (Signed)
Physical Therapy Treatment Patient Details Name: Joyce Fox MRN: [id number] DOB: Feb 19, 1961 Today's Date: 07/07/2020    History of Present Illness Joyce Fox is a 60 y.o. female admitted with back pain, HA, and Rt sided weakness. MRI negative for acute infarct/abnormalities. Pt admitted on 07/04/20 with conversion disorder.  PMH includes:  Migraines, asthma, s/p Rt RCR    PT Comments    Pt received at end of OT session in chair, cooperative and pleasant. Progressing towards goals. Generally min guard for mobility. Cueing needed for hand placement during transfers and for controlled descent to sit. Pt able to use R hamstring to assist with scooting forward in chair. More active with hands today too, showing PT how she uses hands when baking and able to grab onto the RW with no assistance needed. Pt slow but steady with RW. No physical assist given. R LE occasionally dragging on ground, but less so when pt participating in conversation. Still able to progress R LE during gait without assistance. Pt left in chair with all needs met, call bell within reach, and chair alarm active. Will continue to follow acutely. If pt continues to progress, may be able to return home with home health PT.    Follow Up Recommendations  SNF;Supervision for mobility/OOB     Equipment Recommendations  3in1 (PT);Other (comment) (hoyer lift and pads)    Recommendations for Other Services       Precautions / Restrictions Precautions Precautions: Fall Restrictions Weight Bearing Restrictions: No    Mobility  Bed Mobility               General bed mobility comments: Pt received in sitting    Transfers Overall transfer level: Needs assistance   Transfers: Sit to/from Stand   Stand pivot transfers: Min guard       General transfer comment: No physical assist, cueing for controlled descent to chair  Ambulation/Gait Ambulation/Gait assistance: Min guard Gait Distance (Feet): 24 Feet Assistive  device: Rolling walker (2 wheeled) Gait Pattern/deviations: Step-to pattern;Decreased weight shift to right;Narrow base of support Gait velocity: decreased   General Gait Details: No LOB   Stairs             Wheelchair Mobility    Modified Rankin (Stroke Patients Only)       Balance Overall balance assessment: Needs assistance Sitting-balance support: Feet supported;Single extremity supported Sitting balance-Leahy Scale: Fair       Standing balance-Leahy Scale: Poor Standing balance comment: Reliant on UE support                            Cognition Arousal/Alertness: Awake/alert Behavior During Therapy: Flat affect Overall Cognitive Status: Within Functional Limits for tasks assessed                                        Exercises      General Comments        Pertinent Vitals/Pain Pain Assessment: Faces Faces Pain Scale: Hurts a little bit Pain Location: generalized discomfort Pain Descriptors / Indicators: Discomfort;Guarding Pain Intervention(s): Monitored during session    Home Living                      Prior Function            PT Goals (current goals can now be found in  the care plan section) Acute Rehab PT Goals Patient Stated Goal: I want to be able to take of myself Progress towards PT goals: Progressing toward goals    Frequency    Min 5X/week      PT Plan Current plan remains appropriate    Co-evaluation              AM-PAC PT "6 Clicks" Mobility   Outcome Measure  Help needed turning from your back to your side while in a flat bed without using bedrails?: A Little Help needed moving from lying on your back to sitting on the side of a flat bed without using bedrails?: A Little Help needed moving to and from a bed to a chair (including a wheelchair)?: A Little Help needed standing up from a chair using your arms (e.g., wheelchair or bedside chair)?: A Little Help needed to walk  in hospital room?: A Little Help needed climbing 3-5 steps with a railing? : A Lot 6 Click Score: 17    End of Session Equipment Utilized During Treatment: Gait belt Activity Tolerance: Patient tolerated treatment well Patient left: in chair;with call bell/phone within reach;with chair alarm set Nurse Communication: Mobility status PT Visit Diagnosis: Unsteadiness on feet (R26.81);Muscle weakness (generalized) (M62.81)     Time:  -     Charges:                        Rosita Kea, SPT

## 2020-07-07 NOTE — NC FL2 (Signed)
[place] MEDICAID FL2 LEVEL OF CARE SCREENING TOOL     IDENTIFICATION  Patient Name: Joyce Fox Birthdate: 01-30-1961 Sex: female Admission Date (Current Location): 07/04/2020  [facility] and [place] Number:      Facility and Address:  [facility] [place], [address] [place], [place] [address]      Provider Number:    Attending Physician Name and Address:  Lewie Chamber, MD  Relative Name and Phone Number:  Darcus Pester [phone]    Current Level of Care: Hospital Recommended Level of Care: Skilled Nursing Facility Prior Approval Number:    Date Approved/Denied:   PASRR Number: [id number]  Discharge Plan: SNF    Current Diagnoses: Patient Active Problem List   Diagnosis Date Noted  . History of asthma 07/04/2020  . Right sided weakness 07/04/2020  . Intrinsic atopic dermatitis 10/25/2019  . Seasonal and perennial allergic rhinitis 09/30/2018  . Allergic reaction to insect sting 08/02/2018  . Poisoning by bee sting 01/27/2016  . Moderate persistent asthma without complication 01/27/2016  . Anaphylactic reaction to bee sting 12/16/2015  . Other allergic rhinitis 12/16/2015  . 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 Anemia 03/09/2013  . Angina at rest [facility]) 03/09/2013  . Asthma without status asthmaticus 03/09/2013  . Chronic pain 03/09/2013  . Gastroesophageal reflux disease 03/09/2013  . 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 PONV (postoperative nausea and vomiting) 03/09/2013    Orientation RESPIRATION BLADDER Height & Weight     Self,Time,Situation,Place  Normal External catheter Weight: 210 lb 5.1 oz (95.4 kg) Height:  5\' 8"  (172.7 cm)  BEHAVIORAL SYMPTOMS/MOOD NEUROLOGICAL BOWEL NUTRITION STATUS      Continent Diet  AMBULATORY STATUS COMMUNICATION OF NEEDS Skin   Limited Assist Verbally Normal                       Personal Care Assistance Level of  Assistance  Bathing,Dressing Bathing Assistance: Maximum assistance   Dressing Assistance: Maximum assistance     Functional Limitations Info             SPECIAL CARE FACTORS FREQUENCY  PT (By licensed PT),OT (By licensed OT)     PT Frequency: 4-5x/wk OT Frequency: 3-5x/wk            Contractures Contractures Info: Not present    Additional Factors Info  Code Status Code Status Info: FULL             Current Medications (07/07/2020):  This is the current hospital active medication list Current Facility-Administered Medications  Medication Dose Route Frequency Provider Last Rate Last Admin  . 0.9 %  sodium chloride infusion   Intravenous PRN 07/09/2020, MD 10 mL/hr at 07/04/20 1953 New Bag at 07/04/20 1953  . acetaminophen (TYLENOL) tablet 650 mg  650 mg Oral Q4H PRN 07/06/20, MD      . albuterol (VENTOLIN HFA) 108 (90 Base) MCG/ACT inhaler 2 puff  2 puff Inhalation Q6H PRN Delano Metz, MD      . atorvastatin (LIPITOR) tablet 80 mg  80 mg Oral QHS Delano Metz, MD   80 mg at 07/06/20 2214  . cyanocobalamin ((VITAMIN B-12)) injection 1,000 mcg  1,000 mcg Intramuscular Daily 2215, MD      . diphenhydrAMINE (BENADRYL) injection 25 mg  25 mg Intravenous Q6H PRN Lewie Chamber, MD   25 mg at 07/07/20 1106   And  . metoCLOPramide (  REGLAN) injection 10 mg  10 mg Intravenous Q6H PRN Lewie Chamber, MD   10 mg at 07/07/20 1106  . docusate sodium (COLACE) capsule 300 mg  300 mg Oral Q breakfast Lewie Chamber, MD   300 mg at 07/07/20 5573  . enoxaparin (LOVENOX) injection 40 mg  40 mg Subcutaneous Q24H Lewie Chamber, MD   40 mg at 07/06/20 1803  . famotidine (PEPCID) tablet 20 mg  20 mg Oral Daily Delano Metz, MD   20 mg at 07/07/20 2202  . ferrous sulfate tablet 325 mg  325 mg Oral Q breakfast Lewie Chamber, MD   325 mg at 07/07/20 5427  . LORazepam (ATIVAN) tablet 0.5 mg  0.5 mg Oral Q6H PRN Delano Metz, MD   0.5 mg at 07/06/20 2220   . losartan (COZAAR) tablet 25 mg  25 mg Oral Daily Kathleene Hazel, MD   25 mg at 07/07/20 0623  . metoprolol tartrate (LOPRESSOR) tablet 25 mg  25 mg Oral BID Linton Rump, MD   25 mg at 07/07/20 7628  . mometasone-formoterol (DULERA) 100-5 MCG/ACT inhaler 2 puff  2 puff Inhalation BID Delano Metz, MD   2 puff at 07/07/20 0851  . montelukast (SINGULAIR) tablet 10 mg  10 mg Oral QHS Delano Metz, MD   10 mg at 07/06/20 2214  . multivitamin with minerals tablet 1 tablet  1 tablet Oral Daily Lewie Chamber, MD   1 tablet at 07/07/20 [phone]  . ondansetron (ZOFRAN) injection 4 mg  4 mg Intravenous Q6H PRN Hillary Bow, DO      . pregabalin (LYRICA) capsule 75 mg  75 mg Oral Daily Delano Metz, MD   75 mg at 07/07/20 7616  . topiramate (TOPAMAX) tablet 50 mg  50 mg Oral BID Lewie Chamber, MD         Discharge Medications: Please see discharge summary for a list of discharge medications.  Relevant Imaging Results:  Relevant Lab Results:   Additional Information    Marya Landry Dacoda Finlay, LCSWA

## 2020-07-07 NOTE — TOC Initial Note (Addendum)
Transition of Care [facility]) - Initial/Assessment Note    Patient Details  Name: Joyce Fox MRN: [id number] Date of Birth: December 06, 1960  Transition of Care [facility]) CM/SW Contact:    Donnalee Curry, LCSWA Phone Number: 07/07/2020, 12:07 PM  Clinical Narrative:                  SW attempted to speak with pt at bedside but per bedside RN, pt was just given benadryl and currently asleep. Pt's husband called bedside RN while SW at room, and agreed to be contacted for discharge planning needs. Pt's bedside RN also reports pt is agreeable to SNF at d/c  SW spoke with pt's husband Filmore [phone]) via phone. Filmore expressed frustration with providers for trying to prescribe pt psychiatric medications. Filmore states "she don't need pills for her head. They need to work with her in the hospital until she can walk out. I don't understand how she was just fine until she had the MRI and now she can't walk. Y'all are not going to rush her out." SW used reflective listening with Filmore.   Filmore would prefer pt go to CIR, SW explained criteria for CIR and pt would need to be assessed for appropriateness of that LOC. Filmore gave this SW permission to search for SNF in pt's area. SW explained pt would still need authorization from her insurance to go to a facility.   Filmore plans to discuss with pt's outpatient ortho doctor and have him look at pt as well.   Update 242pm SW spoke with Dewayne Hatch [facility] [phone]) to start auth. Ref #[id number]. Clinicals faxed to: [phone]   Expected Discharge Plan: Skilled Nursing Facility Barriers to Discharge: Insurance Authorization,Continued Medical Work up,No SNF bed   Patient Goals and CMS Choice   CMS Medicare.gov Compare Post Acute Care list provided to:: Other (Comment Required) (husband Filmore) Choice offered to / list presented to : Spouse  Expected Discharge Plan and Services Expected Discharge Plan: Skilled Nursing Facility     Post  Acute Care Choice: Skilled Nursing Facility Living arrangements for the past 2 months: Single Family Home Expected Discharge Date: 07/06/20                                    Prior Living Arrangements/Services Living arrangements for the past 2 months: Single Family Home Lives with:: Spouse Patient language and need for interpreter reviewed:: Yes Do you feel safe going back to the place where you live?: Yes        Care giver support system in place?: Yes (comment) (husband)      Activities of Daily Living Home Assistive Devices/Equipment: None ADL Screening (condition at time of admission) Patient's cognitive ability adequate to safely complete daily activities?: Yes Is the patient deaf or have difficulty hearing?: No Does the patient have difficulty seeing, even when wearing glasses/contacts?: No Does the patient have difficulty concentrating, remembering, or making decisions?: No Patient able to express need for assistance with ADLs?: No Does the patient have difficulty dressing or bathing?: No Independently performs ADLs?: Yes (appropriate for developmental age) Does the patient have difficulty walking or climbing stairs?: No Weakness of Legs: Right Weakness of Arms/Hands: Right  Permission Sought/Granted Permission sought to share information with : Facility Industrial/product designer granted to share information with : Yes, Verbal Permission Granted  Share Information with NAME: Filmore  Permission granted to share info w  AGENCY: SNF  Permission granted to share info w Relationship: Husband  Permission granted to share info w Contact Information: [phone]  Emotional Assessment Appearance:: Other (Comment Required (unable to assess) Attitude/Demeanor/Rapport: Unable to Assess Affect (typically observed): Unable to Assess Orientation: : Oriented to Self,Oriented to Place,Oriented to  Time,Oriented to Situation   Psych Involvement: Yes (comment)  (conversion disorder rule out)  Admission diagnosis:  Elevated troponin [R77.8] Weakness of right arm [R29.898] Acute intractable headache, unspecified headache type [R51.9] Chest pain in adult [R07.9] NSTEMI (non-ST elevated myocardial infarction) [facility]) [I21.4] Patient Active Problem List   Diagnosis Date Noted  . History of asthma 07/04/2020  . Right sided weakness 07/04/2020  . Intrinsic atopic dermatitis 10/25/2019  . Seasonal and perennial allergic rhinitis 09/30/2018  . Allergic reaction to insect sting 08/02/2018  . Poisoning by bee sting 01/27/2016  . Moderate persistent asthma without complication 01/27/2016  . Anaphylactic reaction to bee sting 12/16/2015  . Other allergic rhinitis 12/16/2015  . 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 Anemia 03/09/2013  . Angina at rest [facility]) 03/09/2013  . Asthma without status asthmaticus 03/09/2013  . Chronic pain 03/09/2013  . Gastroesophageal reflux disease 03/09/2013  . 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 PONV (postoperative nausea and vomiting) 03/09/2013   PCP:  Ardyth Gal, MD Pharmacy:   [facility] [phone] - [place], [place] - [address] AT [facility] [address] [place] [address] Phone: [phone] Fax: [phone]     Social Determinants of Health (SDOH) Interventions    Readmission Risk Interventions No flowsheet data found.

## 2020-07-07 NOTE — Progress Notes (Signed)
Occupational Therapy Treatment Patient Details Name: Joyce Fox MRN: [id number] DOB: [birth year] Today's Date: 07/07/2020    History of present illness Joyce Fox is a 60 y.o. female admitted with back pain, HA, and Rt sided weakness. MRI negative for acute infarct/abnormalities. Pt admitted on 07/04/20 with conversion disorder.  PMH includes:  Migraines, asthma, s/p Rt RCR   OT comments  Pt making steady progress towards OT goals this session. Pt received on BSC needing min guard - set- up assist for posterior pericare in standing. Pt able to pivot back to recliner with MIN A +1 with RW, cues needed to sequence side steps to recliner and assist to maneuver RW. Pending progress and available family support maybe able to update DC plan to [facility]? Will continue to assess.    Follow Up Recommendations  SNF    Equipment Recommendations  3 in 1 bedside commode    Recommendations for Other Services      Precautions / Restrictions Precautions Precautions: Fall Restrictions Weight Bearing Restrictions: No       Mobility Bed Mobility               General bed mobility comments: pt OOB on BSC upon arrival, left in recliner with PT entering    Transfers Overall transfer level: Needs assistance Equipment used: Rolling walker (2 wheeled) Transfers: Sit to/from [employer] Sit to Stand: Min guard Stand pivot transfers: Min assist       General transfer comment: minguard to rise from [facility], cues needed for hand placement. MIN A to pivot to recliner needing step by step cue to sequence pivotal side stepping over to recliner, assist needed to maneuver RW during transfer    Balance Overall balance assessment: Needs assistance Sitting-balance support: Feet supported Sitting balance-Leahy Scale: Fair     Standing balance support: During functional activity;No upper extremity supported Standing balance-Leahy Scale: Fair Standing balance comment: pt completed posterior  pericare with no UE support and no LOB with min guard assist for safety                           ADL either performed or assessed with clinical judgement   ADL Overall ADL's : Needs assistance/impaired             Lower Body Bathing: Supervison/ safety;Set up;Min guard;Sit to/from stand Lower Body Bathing Details (indicate cue type and reason): simulated via posterior pericare in standing         Toilet Transfer: Minimal assistance;RW;Stand-pivot Toilet Transfer Details (indicate cue type and reason): pt completed stand pivot transfer from BSC>recliner with RW, pt needed step by step cues to sequence pivot steps and requried assist to manage RW Toileting- Clothing Manipulation and Hygiene: Set up;Supervision/safety;Min guard;Sit to/from stand Toileting - Clothing Manipulation Details (indicate cue type and reason): pt completed posterior pericare in standing with set- up assist of wash cloths, min guard assist for balance and safety as pt leaning forward heavily to access buttocks     Functional mobility during ADLs: Minimal assistance;Min guard;Rolling walker General ADL Comments: pt able to take functional steps this session from BSC>recliner and able to engage in BADL particiaption with min guard - set- up assist     Vision       Perception     Praxis      Cognition Arousal/Alertness: Awake/alert Behavior During Therapy: Flat affect Overall Cognitive Status: Within Functional Limits for tasks assessed  General Comments: pt overall WFL for simple mobility commands, pt did require increased time to follow one step commands related to mobility        Exercises     Shoulder Instructions       General Comments VSS on RA    Pertinent Vitals/ Pain       Pain Assessment: Faces Faces Pain Scale: Hurts a little bit Pain Location: generalized discomfort Pain Descriptors / Indicators: Discomfort;Guarding Pain  Intervention(s): Monitored during session  Home Living                                          Prior Functioning/Environment              Frequency  Min 2X/week        Progress Toward Goals  OT Goals(current goals can now be found in the care plan section)  Progress towards OT goals: Progressing toward goals  Acute Rehab OT Goals Patient Stated Goal: none stated Time For Goal Achievement: 07/20/20 Potential to Achieve Goals: Good  Plan Discharge plan remains appropriate;Frequency remains appropriate    Co-evaluation                 AM-PAC OT "6 Clicks" Daily Activity     Outcome Measure   Help from another person eating meals?: None Help from another person taking care of personal grooming?: A Little Help from another person toileting, which includes using toliet, bedpan, or urinal?: A Little Help from another person bathing (including washing, rinsing, drying)?: A Little Help from another person to put on and taking off regular upper body clothing?: None Help from another person to put on and taking off regular lower body clothing?: A Little 6 Click Score: 20    End of Session Equipment Utilized During Treatment: Gait belt;Rolling walker  OT Visit Diagnosis: Unsteadiness on feet (R26.81);Hemiplegia and hemiparesis Hemiplegia - Right/Left: Right Hemiplegia - dominant/non-dominant: Dominant   Activity Tolerance Patient tolerated treatment well   Patient Left in chair;with call bell/phone within reach;Other (comment) (PT entering to work with pt)   Nurse Communication Mobility status        Time: 7628-3151 OT Time Calculation (min): 9 min  Charges: OT General Charges $OT Visit: 1 Visit OT Treatments $Self Care/Home Management : 8-22 mins  Lenor Derrick., COTA/L Acute Rehabilitation Services [phone]    Barron Schmid 07/07/2020, 4:09 PM
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
## 2020-07-08 DIAGNOSIS — R079 Chest pain, unspecified: Secondary | ICD-10-CM | POA: Diagnosis not present

## 2020-07-08 LAB — BASIC METABOLIC PANEL
Anion gap: 8 (ref 5–15)
BUN: 10 mg/dL (ref 8–23)
CO2: 20 mmol/L — ABNORMAL LOW (ref 22–32)
Calcium: 9.2 mg/dL (ref 8.9–10.3)
Chloride: 108 mmol/L (ref 98–111)
Creatinine, Ser: 1 mg/dL (ref 0.44–1.00)
GFR, Estimated: >60 mL/min (ref >60)
Glucose, Bld: 102 mg/dL — ABNORMAL HIGH (ref 70–99)
Potassium: 4.1 mmol/L (ref 3.5–5.1)
Sodium: 136 mmol/L (ref 135–145)

## 2020-07-08 LAB — CBC WITH DIFFERENTIAL/PLATELET
Abs Immature Granulocytes: 0.08 10*3/uL — ABNORMAL HIGH (ref 0.00–0.07)
Basophils Absolute: 0.1 10*3/uL (ref 0.0–0.1)
Basophils Relative: 0 %
Eosinophils Absolute: 0.2 10*3/uL (ref 0.0–0.5)
Eosinophils Relative: 2 %
HCT: 41 % (ref 36.0–46.0)
Hemoglobin: 12.9 g/dL (ref 12.0–15.0)
Immature Granulocytes: 1 %
Lymphocytes Relative: 31 %
Lymphs Abs: 4.3 10*3/uL — ABNORMAL HIGH (ref 0.7–4.0)
MCH: 25.9 pg — ABNORMAL LOW (ref 26.0–34.0)
MCHC: 31.5 g/dL (ref 30.0–36.0)
MCV: 82.3 fL (ref 80.0–100.0)
Monocytes Absolute: 1 10*3/uL (ref 0.1–1.0)
Monocytes Relative: 7 %
Neutro Abs: 8.3 10*3/uL — ABNORMAL HIGH (ref 1.7–7.7)
Neutrophils Relative %: 59 %
Platelets: 402 10*3/uL — ABNORMAL HIGH (ref 150–400)
RBC: 4.98 MIL/uL (ref 3.87–5.11)
RDW: 15.4 % (ref 11.5–15.5)
WBC: 14 10*3/uL — ABNORMAL HIGH (ref 4.0–10.5)
nRBC: 0 % (ref 0.0–0.2)

## 2020-07-08 LAB — MAGNESIUM: Magnesium: 1.8 mg/dL (ref 1.7–2.4)

## 2020-07-08 MED ORDER — LOPERAMIDE HCL 2 MG PO CAPS
2.0000 mg | ORAL_CAPSULE | ORAL | Status: DC | PRN
  Administered 2020-07-08: 2 mg via ORAL
  Filled 2020-07-08: qty 1

## 2020-07-08 NOTE — Progress Notes (Signed)
Inpatient Rehab Admissions Coordinator Note:   Per request, pt was screened for CIR candidacy by Estill Dooms, PT, DPT.  At this time, pt does not meet criteria for CIR.  She is mobilizing at too high a level (supervision/min guard to ambulate household distances, and for ADLs).  Also note that [facility] will not approve diagnosis of conversion disorder for CIR.  Please contact me with questions.   Estill Dooms, PT, DPT [phone] 07/08/20 12:16 PM
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
## 2020-07-08 NOTE — Progress Notes (Signed)
Physical Therapy Treatment Patient Details Name: Joyce Fox MRN: [id number] DOB: 17-Apr-1960 Today's Date: 07/08/2020    History of Present Illness Joyce Fox is a 60 y.o. female admitted with back pain, HA, and Rt sided weakness. MRI negative for acute infarct/abnormalities. Pt admitted on 07/04/20 with conversion disorder.  PMH includes:  Migraines, asthma, s/p Rt RCR    PT Comments    Pt received in bed, cooperative and pleasant. Husband present for session. Pt generally at supervision to min guard level with increased time for all mobility. Able to perform supine to sit towards R side today, actively reaching for R arm and bringing R LE off bed. Pt able to move cell phone off bed to safe area with R UE. Able to slightly progress gait distance and pt willing to step outside into hallway but pt politely declined further ambulation in hallway due to the lights hurting her head. Pt with very slow, steady gait with no LOB. Able to progress R LE on own but decreased R toe off. At one point, pt stating "my leg is stuck" during a turn, but was able to keep moving when engaged in conversation with PT. Pt left in bed with all needs met, call bell within reach, and bed alarm active. Will continue to follow acutely.    Follow Up Recommendations  SNF;Supervision for mobility/OOB     Equipment Recommendations  3in1 (PT)    Recommendations for Other Services       Precautions / Restrictions Precautions Precautions: Fall Precaution Comments: photophobia Restrictions Weight Bearing Restrictions: No    Mobility  Bed Mobility Overal bed mobility: Needs Assistance Bed Mobility: Supine to Sit     Supine to sit: Supervision Sit to supine: Min assist   General bed mobility comments: Pt able to raise R LE about half the height of bed, min A to help move R LE fully into bed during sit to supine    Transfers Overall transfer level: Needs assistance Equipment used: Rolling walker (2  wheeled) Transfers: Sit to/from Stand Sit to Stand: Min guard         General transfer comment: Cueing for hand placement  Ambulation/Gait Ambulation/Gait assistance: Min guard Gait Distance (Feet): 30 Feet Assistive device: Rolling walker (2 wheeled) Gait Pattern/deviations: Step-to pattern;Decreased weight shift to right;Narrow base of support Gait velocity: decreased   General Gait Details: No LOB   Stairs             Wheelchair Mobility    Modified Rankin (Stroke Patients Only)       Balance Overall balance assessment: Needs assistance Sitting-balance support: Feet supported;Single extremity supported Sitting balance-Leahy Scale: Fair       Standing balance-Leahy Scale: Poor Standing balance comment: Reliant on UE support                            Cognition Arousal/Alertness: Awake/alert Behavior During Therapy: Flat affect Overall Cognitive Status: Within Functional Limits for tasks assessed                                 General Comments: increased time to follow one step commands related to mobility      Exercises      General Comments        Pertinent Vitals/Pain Pain Assessment: No/denies pain    Home Living  Prior Function            PT Goals (current goals can now be found in the care plan section) Acute Rehab PT Goals Patient Stated Goal: I want to be able to take of myself Progress towards PT goals: Progressing toward goals    Frequency    Min 5X/week      PT Plan Current plan remains appropriate    Co-evaluation              AM-PAC PT "6 Clicks" Mobility   Outcome Measure  Help needed turning from your back to your side while in a flat bed without using bedrails?: A Little Help needed moving from lying on your back to sitting on the side of a flat bed without using bedrails?: A Little Help needed moving to and from a bed to a chair (including a  wheelchair)?: A Little Help needed standing up from a chair using your arms (e.g., wheelchair or bedside chair)?: A Little Help needed to walk in hospital room?: A Little Help needed climbing 3-5 steps with a railing? : A Lot 6 Click Score: 17    End of Session Equipment Utilized During Treatment: Gait belt Activity Tolerance: Patient tolerated treatment well Patient left: with call bell/phone within reach;in bed;with bed alarm set Nurse Communication: Mobility status PT Visit Diagnosis: Unsteadiness on feet (R26.81);Muscle weakness (generalized) (M62.81)     Time:  -     Charges:                        Rosita Kea, SPT

## 2020-07-09 DIAGNOSIS — R079 Chest pain, unspecified: Secondary | ICD-10-CM | POA: Diagnosis not present

## 2020-07-09 LAB — RESP PANEL BY RT-PCR (FLU A&B, COVID) ARPGX2
Influenza A by PCR: NEGATIVE
Influenza B by PCR: NEGATIVE
SARS Coronavirus 2 by RT PCR: NEGATIVE

## 2020-07-09 MED ORDER — DOCUSATE SODIUM 100 MG PO CAPS: 100.0000 mg | ORAL_CAPSULE | Freq: Every day | ORAL | 0 refills | Status: AC | PRN

## 2020-07-09 MED ORDER — ADULT MULTIVITAMIN W/MINERALS CH: 1.0000 | ORAL_TABLET | Freq: Every day | ORAL | Status: AC

## 2020-07-09 MED ORDER — LOSARTAN POTASSIUM 25 MG PO TABS
25.0000 mg | ORAL_TABLET | Freq: Every day | ORAL | Status: AC
Start: 2020-07-10 — End: ?

## 2020-07-09 MED ORDER — METOPROLOL TARTRATE 25 MG PO TABS: 25.0000 mg | ORAL_TABLET | Freq: Two times a day (BID) | ORAL | Status: AC

## 2020-07-09 MED ORDER — ATORVASTATIN CALCIUM 80 MG PO TABS: 80.0000 mg | ORAL_TABLET | Freq: Every day | ORAL | Status: AC
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
## 2020-07-09 NOTE — Care Management CC44 (Signed)
Condition Code 44 Documentation Completed  Patient Details  Name: Norie Latendresse MRN: [id number] Date of Birth: 09-22-1960   Condition Code 44 given:  Yes Patient signature on Condition Code 44 notice:  Yes Documentation of 2 MD's agreement:  Yes Code 44 added to claim:  Yes    Leone Haven, RN 07/09/2020, 2:50 PM

## 2020-07-09 NOTE — Care Management Important Message (Signed)
Important Message  Patient Details  Name: Joyce Fox MRN: [id number] Date of Birth: 1960/12/02   Medicare Important Message Given:  Yes     [name] 07/09/2020, 12:32 PM

## 2020-07-09 NOTE — Progress Notes (Signed)
Received follow up phone call from patients husband this AM.  He confirms that the level of care that his wife is requiring at this time is not something he can provide in the home.  He reports and their preferred choice of SNF would be [facility].  This is the same message they provided me in 3 separate conversations yesterday.    Jacqulyn Cane RN, BSN, CCRN-K

## 2020-07-09 NOTE — TOC Transition Note (Addendum)
Transition of Care [facility]) - CM/SW Discharge Note   Patient Details  Name: Joyce Fox MRN: [id number] Date of Birth: 06-07-1960  Transition of Care [facility]) CM/SW Contact:  Terrial Rhodes, LCSWA Phone Number: 07/09/2020, 3:42 PM   Clinical Narrative:     Patient will DC to: [facility]  Anticipated DC date: 07/09/2020  Family notified: [place]    Transport by: Sharin Mons  ?  Per MD patient ready for DC to [facility] . RN, patient, patient's family, and facility notified of DC. Discharge Summary sent to facility. RN given number for report tele#[phone] RM#[id number]. DC packet on chart. Ambulance transport requested for patient.  CSW signing off.    Final next level of care: Skilled Nursing Facility Barriers to Discharge: No Barriers Identified   Patient Goals and CMS Choice Patient states their goals for this hospitalization and ongoing recovery are:: to go to SNF CMS Medicare.gov Compare Post Acute Care list provided to:: Patient Choice offered to / list presented to : Patient  Discharge Placement              Patient chooses bed at: [facility] Patient to be transferred to facility by: [facility] Name of family member notified: [place] Patient and family notified of of transfer: 07/09/20  Discharge Plan and Services     Post Acute Care Choice: Skilled Nursing Facility                               Social Determinants of Health (SDOH) Interventions     Readmission Risk Interventions No flowsheet data found.

## 2020-07-09 NOTE — TOC Progression Note (Addendum)
Transition of Care [facility]) - Progression Note    Patient Details  Name: Joyce Fox MRN: [id number] Date of Birth: 11-04-1960  [facility] CM/SW Contact  Terrial Rhodes, LCSWA Phone Number: 07/09/2020, 1:52 PM  Clinical Narrative:     CSW spoke with patient at bedside. Patient chose SNF placement at [facility]. CSW called Nicki with [facility] who confirmed they cannot accept patient for SNF placement due to not having any isolation rooms. Patient has only received first covid vaccine. CSW informed patient. Patient has now chosen SNF placement with [facility]. CSW called [facility] and spoke to [place] who confirmed they can accept patient for SNF placement. CSW informed patient. CSW called patients insurance and provided SNF choice. Patients insurance has been approved. Berkley Harvey ID# is [id number]. Insurance authorization has been approved for 3 days. Start date is 4/19. Next review date is 4/21. CSW requested covid for patient. CSW received consult for resources for outpatient psychiatry and counseling resources. CSW offered patient resources for psychiatry and counseling and patient accepted. CSW will continue to follow and assist with discharge planning needs.  Patient has SN[facility] bed [facility]. Insurance authorization has been approved. CSW will continue to follow and assist with discharge planning needs.   Expected Discharge Plan: Skilled Nursing Facility Barriers to Discharge: Insurance Authorization,Continued Medical Work up,No SNF bed  Expected Discharge Plan and Services Expected Discharge Plan: Skilled Nursing Facility     Post Acute Care Choice: Skilled Nursing Facility Living arrangements for the past 2 months: Single Family Home Expected Discharge Date: 07/06/20                                     Social Determinants of Health (SDOH) Interventions    Readmission Risk Interventions No flowsheet data found.

## 2020-07-09 NOTE — Care Management Obs Status (Signed)
MEDICARE OBSERVATION STATUS NOTIFICATION   Patient Details  Name: Joyce Fox MRN: [id number] Date of Birth: 07/13/1960   Medicare Observation Status Notification Given:  Yes    Leone Haven, RN 07/09/2020, 2:50 PM

## 2020-07-09 NOTE — Progress Notes (Signed)
Physical Therapy Treatment Patient Details Name: Joyce Fox MRN: [id number] DOB: [birth year] Today's Date: 07/09/2020    History of Present Illness Joyce Fox is a 60 y.o. female admitted with back pain, HA, and Rt sided weakness. MRI negative for acute infarct/abnormalities. Pt admitted on 07/04/20 with conversion disorder.  PMH includes:  Migraines, asthma, s/p Rt RCR    PT Comments    Pt received in bed, reporting that she didn't sleep well because she was up all night with diarrhea. Pt initially at supervision to min guard level, but after navigating one step with RW, pt reporting she felt unsteady and R LE was about to give out. One LOB requiring mod A for steadying and needed BSC brought up behind her to sit. However, up until that point, pt steady with RW and able to advance B LEs forward and up/down 1 step. Afterwards pt able to ambulate back to bed with no issues at min guard level.   Pt stating that "if it wasn't for the step, I could have walked in the hallway," but yesterday stated that she can't walk in the hallway due to photophobia. Pt also using her UEs to move her LEs to EOB, although yesterday was able to move both LEs fine. Overall, inconsistent presentation.  Pt able to use both UEs to put on new gown as well as rearrange linens prior to returning to bed. During supine to sit pt asked for assistance with R LE, PT gave min A to L LE extremity. Pt able to bring R LE up to the bed by herself with no assistance from PT. Left in bed with all needs met, call bell within reach, and bed alarm active. Will continue to follow acutely.    Follow Up Recommendations  SNF;Supervision for mobility/OOB     Equipment Recommendations  3in1 (PT)    Recommendations for Other Services       Precautions / Restrictions Precautions Precautions: Fall Precaution Comments: photophobia Restrictions Weight Bearing Restrictions: No    Mobility  Bed Mobility Overal bed mobility: Needs  Assistance Bed Mobility: Supine to Sit;Sit to Supine     Supine to sit: Supervision Sit to supine: Min assist        Transfers Overall transfer level: Needs assistance Equipment used: Rolling walker (2 wheeled) Transfers: Sit to/from Stand Sit to Stand: Min guard         General transfer comment: Cueing for hand placement  Ambulation/Gait Ambulation/Gait assistance: Min guard;Mod assist Gait Distance (Feet): 10 Feet (8+10) Assistive device: Rolling walker (2 wheeled) Gait Pattern/deviations: Step-to pattern;Decreased weight shift to right;Narrow base of support;Decreased dorsiflexion - right Gait velocity: decreased   General Gait Details: Initially min guard, once LOB during ambulation after step that required mod A to steady   Stairs Stairs: Yes Stairs assistance: Min guard Stair Management: Step to pattern;Forwards;No rails;With walker Number of Stairs: 1 General stair comments: min guard for safety, cueing for sequencing   Wheelchair Mobility    Modified Rankin (Stroke Patients Only)       Balance Overall balance assessment: Needs assistance Sitting-balance support: Feet supported;No upper extremity supported Sitting balance-Leahy Scale: Fair Sitting balance - Comments: Able to sit EOB and adjust pillows and sheets without LOB     Standing balance-Leahy Scale: Poor Standing balance comment: Reliant on UE support                            Cognition Arousal/Alertness: Awake/alert Behavior  During Therapy: Flat affect Overall Cognitive Status: Within Functional Limits for tasks assessed                                 General Comments: increased time to follow one step commands related to mobility      Exercises      General Comments        Pertinent Vitals/Pain Pain Assessment: Faces Faces Pain Scale: Hurts a little bit Pain Location: generalized discomfort, headache Pain Descriptors / Indicators:  Discomfort;Guarding Pain Intervention(s): Monitored during session    Home Living                      Prior Function            PT Goals (current goals can now be found in the care plan section)      Frequency    Min 5X/week      PT Plan Current plan remains appropriate    Co-evaluation              AM-PAC PT "6 Clicks" Mobility   Outcome Measure  Help needed turning from your back to your side while in a flat bed without using bedrails?: A Little Help needed moving from lying on your back to sitting on the side of a flat bed without using bedrails?: A Little Help needed moving to and from a bed to a chair (including a wheelchair)?: A Little Help needed standing up from a chair using your arms (e.g., wheelchair or bedside chair)?: A Little Help needed to walk in hospital room?: A Little Help needed climbing 3-5 steps with a railing? : A Lot 6 Click Score: 17    End of Session Equipment Utilized During Treatment: Gait belt Activity Tolerance: Patient tolerated treatment well;Patient limited by fatigue Patient left: with call bell/phone within reach;in bed;with bed alarm set Nurse Communication: Mobility status PT Visit Diagnosis: Unsteadiness on feet (R26.81);Muscle weakness (generalized) (M62.81)     Time:  -     Charges:              Rosita Kea, SPT

## 2020-07-09 NOTE — Progress Notes (Signed)
Occupational Therapy Treatment Patient Details Name: Cady Hafen MRN: [id number] DOB: Aug 08, 1960 Today's Date: 07/09/2020    History of present illness Joyce Fox is a 60 y.o. female admitted with back pain, HA, and Rt sided weakness. MRI negative for acute infarct/abnormalities. Pt admitted on 07/04/20 with conversion disorder.  PMH includes:  Migraines, asthma, s/p Rt RCR   OT comments  Pt. Did not want to get out of bed or to sit EOB. She stated she was fatigued from PT and from diarrhea. Pt. Was an agreement with performing B UE HEP in supine. Pt. Required AAROM for R UE secondary to weakness. Pt. Able to preform L UE exercises AROM. Acute OT to follow.   Follow Up Recommendations  SNF    Equipment Recommendations  3 in 1 bedside commode    Recommendations for Other Services      Precautions / Restrictions Precautions Precautions: Fall Precaution Comments: photophobia Restrictions Weight Bearing Restrictions: No       Mobility Bed Mobility Overal bed mobility: Needs Assistance Bed Mobility: Supine to Sit;Sit to Supine     Supine to sit: Supervision Sit to supine: Min assist        Transfers Overall transfer level: Needs assistance Equipment used: Rolling walker (2 wheeled) Transfers: Sit to/from Stand Sit to Stand: Min guard         General transfer comment: Cueing for hand placement    Balance Overall balance assessment: Needs assistance Sitting-balance support: Feet supported;No upper extremity supported Sitting balance-Leahy Scale: Fair Sitting balance - Comments: Able to sit EOB and adjust pillows and sheets without LOB     Standing balance-Leahy Scale: Poor Standing balance comment: Reliant on UE support                           ADL either performed or assessed with clinical judgement   ADL                                               Vision       Perception     Praxis      Cognition  Arousal/Alertness: Awake/alert Behavior During Therapy: Flat affect Overall Cognitive Status: Within Functional Limits for tasks assessed                                 General Comments: increased time to follow one step commands related to mobility        Exercises Exercises: General Upper Extremity General Exercises - Upper Extremity Shoulder Flexion: AAROM;20 reps;AROM;Both Shoulder Extension: AAROM;AROM;Both;20 reps Shoulder ABduction: AAROM;AROM;Both;20 reps Shoulder ADduction: AROM;AAROM;Both;20 reps Shoulder Horizontal ABduction: AROM;AAROM;Both;20 reps Shoulder Horizontal ADduction: AROM;AAROM;Both;20 reps Elbow Flexion: AROM;AAROM;20 reps Elbow Extension: AROM;AAROM;Both;20 reps   Shoulder Instructions       General Comments      Pertinent Vitals/ Pain       Pain Assessment: 0-10 Pain Score: 8  Faces Pain Scale: Hurts a little bit Pain Location: headache Pain Descriptors / Indicators: Aching Pain Intervention(s): RN gave pain meds during session  Home Living  Prior Functioning/Environment              Frequency  Min 2X/week        Progress Toward Goals  OT Goals(current goals can now be found in the care plan section)  Progress towards OT goals: Progressing toward goals  Acute Rehab OT Goals Patient Stated Goal: get better OT Goal Formulation: With patient Time For Goal Achievement: 07/20/20 Potential to Achieve Goals: Good ADL Goals Pt Will Perform Grooming: with set-up;sitting Pt Will Perform Upper Body Bathing: with supervision;with set-up;sitting Pt Will Perform Lower Body Bathing: with supervision;sit to/from stand Pt Will Perform Upper Body Dressing: with set-up;sitting Pt Will Perform Lower Body Dressing: with supervision;sit to/from stand Pt Will Transfer to Toilet: with supervision;ambulating;regular height toilet;bedside commode;grab bars Pt Will Perform  Toileting - Clothing Manipulation and hygiene: with supervision;sit to/from stand  Plan Discharge plan remains appropriate;Frequency remains appropriate    Co-evaluation                 AM-PAC OT "6 Clicks" Daily Activity     Outcome Measure   Help from another person eating meals?: None Help from another person taking care of personal grooming?: A Little Help from another person toileting, which includes using toliet, bedpan, or urinal?: A Little Help from another person bathing (including washing, rinsing, drying)?: A Little Help from another person to put on and taking off regular upper body clothing?: None Help from another person to put on and taking off regular lower body clothing?: A Little 6 Click Score: 20    End of Session    OT Visit Diagnosis: Unsteadiness on feet (R26.81);Hemiplegia and hemiparesis Hemiplegia - Right/Left: Right Hemiplegia - dominant/non-dominant: Dominant   Activity Tolerance Patient limited by pain   Patient Left in bed;with call bell/phone within reach;with bed alarm set;with nursing/sitter in room;with family/visitor present   Nurse Communication  (ok therapy)        Time: 6967-8938 OT Time Calculation (min): 29 min  Charges: OT General Charges $OT Visit: 1 Visit OT Treatments $Therapeutic Exercise: 23-37 mins  Derrek Gu OT/L   Aniyia Rane 07/09/2020, 5:10 PM

## 2020-08-12 NOTE — Progress Notes (Deleted)
Cardiology Office Note    Date:  08/12/2020   ID:  Lokelani, Lutes 06-20-1960, MRN [id number]   PCP:  Ardyth Gal, MD   [facility]  Cardiologist:  None *** Advanced Practice Provider:  No care team member to display Electrophysiologist:  None   [id number]}   No chief complaint on file.   History of Present Illness:  Joyce Fox is a 60 y.o. female with history of hemiplegic migraines, asthma, GERD, chronic pain.  She was having an MRI for migraines and became upset and developed new right-sided weakness and  chest pain while in the hospital relieved with nitroglycerin.  Troponins peaked at 310, EKG with T wave inversion laterally and echo showed abnormal LV wall motion with anterior apical and apical segments not moving well EF 50-55%.  There appeared to be a stress-induced cardiomyopathy.  Because of neurological symptoms cath could not be done.  MRI without acute findings and neurology signed off.  Cardiac CTA was ordered to assess her aorta-calcium score 0, normal coronary origin with right dominance.  She was treated with losartan metoprolol and Lipitor.  Patient discharged with psychiatric referal    Past Medical History:  Diagnosis Date  . Allergic rhinitis   . Asthma   . Migraines     Past Surgical History:  Procedure Laterality Date  . SHOULDER ARTHROSCOPY W/ ROTATOR CUFF REPAIR Right 2012    Current Medications: No outpatient medications have been marked as taking for the 08/14/20 encounter (Appointment) with Dyann Kief, PA-C.     Allergies:   Acetaminophen, Aspirin, Chocolate flavor, Other, Pineapple, Prunus persica, Sulfamethoxazole, Tomato, Bee venom, Covid-19 mrna vacc (moderna), Latex, Okra, and Penicillins   Social History   Socioeconomic History  . Marital status: Married    Spouse name: Not on file  . Number of children: Not on file  . Years of education: Not on file  . Highest education level: Not on file   Occupational History  . Not on file  Tobacco Use  . Smoking status: Never Smoker  . Smokeless tobacco: Never Used  Vaping Use  . Vaping Use: Never used  Substance and Sexual Activity  . Alcohol use: No  . Drug use: No  . Sexual activity: Not on file  Other Topics Concern  . Not on file  Social History Narrative  . Not on file   Social Determinants of Health   Financial Resource Strain: Not on file  Food Insecurity: Not on file  Transportation Needs: Not on file  Physical Activity: Not on file  Stress: Not on file  Social Connections: Not on file     Family History:  The patient's ***family history includes Allergic rhinitis in her father and mother; Asthma in her father and mother; Heart attack in her brother and father.   ROS:   Please see the history of present illness.    ROS All other systems reviewed and are negative.   PHYSICAL EXAM:   VS:  There were no vitals taken for this visit.  Physical Exam  GEN: Well nourished, well developed, in no acute distress  HEENT: normal  Neck: no JVD, carotid bruits, or masses Cardiac:RRR; no murmurs, rubs, or gallops  Respiratory:  clear to auscultation bilaterally, normal work of breathing GI: soft, nontender, nondistended, + BS Ext: without cyanosis, clubbing, or edema, Good distal pulses bilaterally MS: no deformity or atrophy  Skin: warm and dry, no rash Neuro:  Alert and Oriented  x 3, Strength and sensation are intact Psych: euthymic mood, full affect  Wt Readings from Last 3 Encounters:  07/09/20 195 lb 3.2 oz (88.5 kg)  06/30/20 204 lb (92.5 kg)  04/24/20 198 lb (89.8 kg)      Studies/Labs Reviewed:   EKG:  EKG is*** ordered today.  The ekg ordered today demonstrates ***  Recent Labs: 07/04/2020: ALT 34 07/05/2020: TSH 1.609 07/08/2020: BUN 10; Creatinine, Ser 1.00; Hemoglobin 12.9; Magnesium 1.8; Platelets 402; Potassium 4.1; Sodium 136   Lipid Panel    Component Value Date/Time   CHOL 167 07/05/2020  0221   TRIG 77 07/05/2020 0221   HDL 44 07/05/2020 0221   CHOLHDL 3.8 07/05/2020 0221   VLDL 15 07/05/2020 0221   LDLCALC 108 (H) 07/05/2020 0221    Additional studies/ records that were reviewed today include:  2D echo 07/05/20 IMPRESSIONS     1. Left ventricular ejection fraction, by estimation, is 50 to 55%. The  left ventricle has low normal function. The left ventricle demonstrates  regional wall motion abnormalities (see scoring diagram/findings for  description). Left ventricular diastolic   parameters are indeterminate. There is mild hypokinesis of the left  ventricular, mid-apical anteroseptal wall, inferolateral wall,  inferoseptal wall, anterior wall, inferior wall, anterolateral wall and  apical segment.   2. Right ventricular systolic function is moderately reduced. The right  ventricular size is normal. There is normal pulmonary artery systolic  pressure.   3. The mitral valve is normal in structure. Trivial mitral valve  regurgitation. No evidence of mitral stenosis.   4. The aortic valve is tricuspid. Aortic valve regurgitation is not  visualized. No aortic stenosis is present.   5. The inferior vena cava is normal in size with greater than 50%  respiratory variability, suggesting right atrial pressure of 3 mmHg.   Comparison(s): No prior Echocardiogram.   Conclusion(s)/Recommendation(s): There are very mild wall motion  abnormalities in the distal LV walls. This could be consistent with stress  induced cardiomyopathy. However, there is more hypokinesis in the apical  anteroseptal segment compard to other, so   cannot exclude ischemia on this study.   Coronary CTA 07/05/2020 IMPRESSION: 1. No evidence of CAD, CADRADS = 0.   2. Coronary calcium score of 0. This was 0 percentile for age and sex matched control.   3. Normal coronary origin with right dominance.     Risk Assessment/Calculations:   {Does this patient have ATRIAL  FIBRILLATION?:[phone]}     ASSESSMENT:    1. Takotsubo cardiomyopathy      PLAN:  In order of problems listed above:  Takotsubo cardiomyopathy elevated troponins and chest pain relieved with nitroglycerin. Wall motion abnormality on 2D echo mild hypokinesis in the mid apical anterior septal wall and inferolateral wall, LVEF 50 to 55%.  Coronary CTA calcium score 0 normal coronary arteries.  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 stress test, cath, DCCV, TEE, etc)?   Press F2        :[id number]}    Medication Adjustments/Labs and Tests Ordered: Current medicines are reviewed at length with the patient today.  Concerns regarding medicines are outlined above.  Medication changes, Labs and Tests ordered today are listed in the Patient Instructions below. There are no Patient Instructions on file for this visit.   Elson Clan, PA-C  08/12/2020 1:02 PM  [facility] [address], [place], [place]  [address] Phone: [phone]; Fax: [phone]

## 2020-08-14 ENCOUNTER — Ambulatory Visit: Payer: Medicare HMO | Admitting: Physician Assistant

## 2020-08-14 DIAGNOSIS — I5181 Takotsubo syndrome: Secondary | ICD-10-CM

## 2020-12-25 ENCOUNTER — Other Ambulatory Visit: Payer: Self-pay | Admitting: Family Medicine

## 2021-01-23 ENCOUNTER — Other Ambulatory Visit: Payer: Self-pay | Admitting: Family Medicine
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
## 2022-04-21 ENCOUNTER — Ambulatory Visit: Payer: Medicare HMO | Admitting: Family Medicine

## 2022-04-21 ENCOUNTER — Encounter: Payer: Self-pay | Admitting: Family Medicine

## 2022-04-21 VITALS — BP 120/80 | HR 83 | Temp 98.1°F | Resp 18 | Ht 68.0 in | Wt 209.0 lb

## 2022-04-21 DIAGNOSIS — J4541 Moderate persistent asthma with (acute) exacerbation: Secondary | ICD-10-CM

## 2022-04-21 DIAGNOSIS — T7800XA Anaphylactic reaction due to unspecified food, initial encounter: Secondary | ICD-10-CM

## 2022-04-21 DIAGNOSIS — L501 Idiopathic urticaria: Secondary | ICD-10-CM

## 2022-04-21 DIAGNOSIS — J3089 Other allergic rhinitis: Secondary | ICD-10-CM

## 2022-04-21 DIAGNOSIS — J4540 Moderate persistent asthma, uncomplicated: Secondary | ICD-10-CM

## 2022-04-21 DIAGNOSIS — K219 Gastro-esophageal reflux disease without esophagitis: Secondary | ICD-10-CM | POA: Diagnosis not present

## 2022-04-21 DIAGNOSIS — T7800XD Anaphylactic reaction due to unspecified food, subsequent encounter: Secondary | ICD-10-CM

## 2022-04-21 DIAGNOSIS — L2084 Intrinsic (allergic) eczema: Secondary | ICD-10-CM | POA: Diagnosis not present

## 2022-04-21 DIAGNOSIS — J302 Other seasonal allergic rhinitis: Secondary | ICD-10-CM

## 2022-04-21 MED ORDER — PULMICORT FLEXHALER 180 MCG/ACT IN AEPB: 2.0000 | INHALATION_SPRAY | Freq: Two times a day (BID) | RESPIRATORY_TRACT | 5 refills | Status: DC

## 2022-04-21 MED ORDER — PREDNISONE 10 MG PO TABS: ORAL_TABLET | ORAL | 0 refills | Status: DC

## 2022-04-21 NOTE — Patient Instructions (Addendum)
Asthma Prednisone 10 mg tablets. Take 2 tablets once a day for 4 days, then take 1 tablet on the 5th day, then stop.   Continue Pulmicort 180- 2 puffs twice a day with a spacer to prevent cough and wheeze.  Call the clinic if Pulmicort is not well covered under your insurance plan. Continue Singulair 10 mg once a day to help prevent cough and wheeze. May use albuterol 2 puffs every 4 hours as needed for cough, wheeze, tightness in chest or shortness of breath.   You may use albuterol 2 puffs 5 to 15 minutes prior to exercise   Allergic rhinitis Continue allergen avoidance measures directed toward dust mite, cat, and pollen as listed below Begin Flonase 2 sprays each nostril once a day to help prevent stuffy nose.  In the right nostril, point the applicator out toward the right ear. In the left nostril, point the applicator out toward the left ear Consider saline nasal rinses as needed for nasal symptoms. Use this before any medicated nasal sprays for best result Begin Xyzal 5 mg once a day for a runny nose or itch. This will replace cetirizine for now For thick post nasal drainage, begin Mucinex [phone] mg twice a day and increase fluid intake as tolerated.  Consider updating your environmental allergy testing.  Remember to stop antihistamines for 3 days before your testing appointment.  After allergy testing, consider allergen immunotherapy if your symptoms are not well-controlled with the treatment plan as listed above  Reflux Continue dietary and lifestyle modifications as listed below Continue pantoprazole 40 mg once a day as you have been   Atopic dermatitis Continue a daily moisturizing routine Continue hydrocortisone 1% to red itchy areas on your face and neck Continue triamcinolone 0.1% ointment to red itchy areas below your face or neck twice a day as needed   Urticaria Continue an antihistamine once a day as needed for itch.  He may take an additional antihistamine once a day as  needed for breakthrough symptoms  Food allergy Continue to avoid shellfish. In case of an allergic reaction, give Benadryl 50 mg  every 4 hours, and if life-threatening symptoms occur, inject with EpiPen 0.3 mg. Consider updating your food allergy testing.  Remember to stop antihistamines for 3 days before food allergy testing appointment  Call the clinic if this treatment plan is not working well for you  Follow up in 1 month or sooner if needed.  Reducing Pollen Exposure The American Academy of Allergy, Asthma and Immunology suggests the following steps to reduce your exposure to pollen during allergy seasons. Do not hang sheets or clothing out to dry; pollen may collect on these items. Do not mow lawns or spend time around freshly cut grass; mowing stirs up pollen. Keep windows closed at night.  Keep car windows closed while driving. Minimize morning activities outdoors, a time when pollen counts are usually at their highest. Stay indoors as much as possible when pollen counts or humidity is high and on windy days when pollen tends to remain in the air longer. Use air conditioning when possible.  Many air conditioners have filters that trap the pollen spores. Use a HEPA room air filter to remove pollen form the indoor air you breathe.  Control of Dog or Cat Allergen Avoidance is the best way to manage a dog or cat allergy. If you have a dog or cat and are allergic to dog or cats, consider removing the dog or cat from the home. If  you have a dog or cat but don't want to find it a new home, or if your family wants a pet even though someone in the household is allergic, here are some strategies that may help keep symptoms at bay:  Keep the pet out of your bedroom and restrict it to only a few rooms. Be advised that keeping the dog or cat in only one room will not limit the allergens to that room. Don't pet, hug or kiss the dog or cat; if you do, wash your hands with soap and  water. High-efficiency particulate air (HEPA) cleaners run continuously in a bedroom or living room can reduce allergen levels over time. Regular use of a high-efficiency vacuum cleaner or a central vacuum can reduce allergen levels. Giving your dog or cat a bath at least once a week can reduce airborne allergen.   Control of Dust Mite Allergen Dust mites play a major role in allergic asthma and rhinitis. They occur in environments with high humidity wherever human skin is found. Dust mites absorb humidity from the atmosphere (ie, they do not drink) and feed on organic matter (including shed human and animal skin). Dust mites are a microscopic type of insect that you cannot see with the naked eye. High levels of dust mites have been detected from mattresses, pillows, carpets, upholstered furniture, bed covers, clothes, soft toys and any woven material. The principal allergen of the dust mite is found in its feces. A gram of dust may contain 1,000 mites and 250,000 fecal particles. Mite antigen is easily measured in the air during house cleaning activities. Dust mites do not bite and do not cause harm to humans, other than by triggering allergies/asthma.  Ways to decrease your exposure to dust mites in your home:  1. Encase mattresses, box springs and pillows with a mite-impermeable barrier or cover  2. Wash sheets, blankets and drapes weekly in hot water (130 F) with detergent and dry them in a dryer on the hot setting.  3. Have the room cleaned frequently with a vacuum cleaner and a damp dust-mop. For carpeting or rugs, vacuuming with a vacuum cleaner equipped with a high-efficiency particulate air (HEPA) filter. The dust mite allergic individual should not be in a room which is being cleaned and should wait 1 hour after cleaning before going into the room.  4. Do not sleep on upholstered furniture (eg, couches).  5. If possible removing carpeting, upholstered furniture and drapery from the home  is ideal. Horizontal blinds should be eliminated in the rooms where the person spends the most time (bedroom, study, television room). Washable vinyl, roller-type shades are optimal.  6. Remove all non-washable stuffed toys from the bedroom. Wash stuffed toys weekly like sheets and blankets above.  7. Reduce indoor humidity to less than 50%. Inexpensive humidity monitors can be purchased at most hardware stores. Do not use a humidifier as can make the problem worse and are not recommended.
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
## 2022-05-18 NOTE — Progress Notes (Unsigned)
   [place] [address] Dept: [phone]  FOLLOW UP NOTE  Patient ID: Joyce Fox, female    DOB: Jan 03, 1961  Age: 62 y.o. MRN: [id number] Date of Office Visit: 05/19/2022  Assessment  Chief Complaint: No chief complaint on file.  HPI Joyce Fox is a 62 year old female who presents to the clinic for follow-up visit.  She was last seen in this clinic on 04/21/2022 for evaluation of asthma with acute exacerbation requiring prednisone, allergic rhinitis, reflux, atopic dermatitis, urticaria, and food allergy to shellfish.  Her last environmental allergy skin testing was on 12/16/2015 and was positive to pollens, dust mite, and cat.   Drug Allergies:  Allergies  Allergen Reactions   Acetaminophen Hives   Aspirin Other (See Comments)    Per allergy test   Chocolate Flavor Other (See Comments)    Rash in mouth   Other Hives    Pain medication, pripoxyphone   Pineapple Other (See Comments)    Rash in mouth   Prunus Persica Other (See Comments)    Rash in mouth   Sulfamethoxazole Hives   Tomato Other (See Comments)    Rash in mouth   Bee Venom Rash and Hives   Covid-19 Mrna Vacc (Moderna) Rash   Latex Rash and Hives   Okra Rash   Penicillins Rash    Physical Exam: There were no vitals taken for this visit.   Physical Exam  Diagnostics:    Assessment and Plan: No diagnosis found.  No orders of the defined types were placed in this encounter.   There are no Patient Instructions on file for this visit.  No follow-ups on file.    Thank you for the opportunity to care for this patient.  Please do not hesitate to contact me with questions.  Gareth Morgan, FNP [facility] [place]

## 2022-05-18 NOTE — Patient Instructions (Incomplete)
Asthma/cough Begin prednisone 10 mg tablets. Take 2 tablets twice a day for 3 days, then take 2 tablets once a day for 1 day, then take 1 tablet on the 5th day, then stop Continue Pulmicort 180- 2 puffs twice a day with a spacer to prevent cough and wheeze.  Call the clinic if Pulmicort is not well covered under your insurance plan. Continue Singulair 10 mg once a day to help prevent cough and wheeze. May use albuterol 2 puffs every 4 hours as needed for cough, wheeze, tightness in chest or shortness of breath.   You may use albuterol 2 puffs 5 to 15 minutes prior to exercise   Allergic rhinitis Continue allergen avoidance measures directed toward dust mite, cat, and pollen as listed below Begin Flonase 2 sprays each nostril once a day to help prevent stuffy nose.  In the right nostril, point the applicator out toward the right ear. In the left nostril, point the applicator out toward the left ear Consider saline nasal rinses as needed for nasal symptoms. Use this before any medicated nasal sprays for best result Begin Xyzal 5 mg once a day for a runny nose or itch. This will replace cetirizine for now For thick post nasal drainage, begin Mucinex [phone] mg twice a day and increase fluid intake as tolerated.  Consider updating your environmental allergy testing.  Remember to stop antihistamines for 3 days before your testing appointment.  After allergy testing, consider allergen immunotherapy if your symptoms are not well-controlled with the treatment plan as listed above  Reflux Continue dietary and lifestyle modifications as listed below Continue pantoprazole 40 mg once a day as you have been   Atopic dermatitis Continue a daily moisturizing routine Continue hydrocortisone 1% to red itchy areas on your face and neck Continue triamcinolone 0.1% ointment to red itchy areas below your face or neck twice a day as needed   Urticaria Continue an antihistamine once a day as needed for itch.  He  may take an additional antihistamine once a day as needed for breakthrough symptoms  Food allergy Continue to avoid shellfish. In case of an allergic reaction, give Benadryl 50 mg  every 4 hours, and if life-threatening symptoms occur, inject with EpiPen 0.3 mg. Consider updating your food allergy testing.  Remember to stop antihistamines for 3 days before food allergy testing appointment  Call the clinic if this treatment plan is not working well for you  Follow up in 1-2 months or sooner if needed.    Reducing Pollen Exposure The American Academy of Allergy, Asthma and Immunology suggests the following steps to reduce your exposure to pollen during allergy seasons. Do not hang sheets or clothing out to dry; pollen may collect on these items. Do not mow lawns or spend time around freshly cut grass; mowing stirs up pollen. Keep windows closed at night.  Keep car windows closed while driving. Minimize morning activities outdoors, a time when pollen counts are usually at their highest. Stay indoors as much as possible when pollen counts or humidity is high and on windy days when pollen tends to remain in the air longer. Use air conditioning when possible.  Many air conditioners have filters that trap the pollen spores. Use a HEPA room air filter to remove pollen form the indoor air you breathe.  Control of Dog or Cat Allergen Avoidance is the best way to manage a dog or cat allergy. If you have a dog or cat and are allergic to dog or  cats, consider removing the dog or cat from the home. If you have a dog or cat but don't want to find it a new home, or if your family wants a pet even though someone in the household is allergic, here are some strategies that may help keep symptoms at bay:  Keep the pet out of your bedroom and restrict it to only a few rooms. Be advised that keeping the dog or cat in only one room will not limit the allergens to that room. Don't pet, hug or kiss the dog or cat;  if you do, wash your hands with soap and water. High-efficiency particulate air (HEPA) cleaners run continuously in a bedroom or living room can reduce allergen levels over time. Regular use of a high-efficiency vacuum cleaner or a central vacuum can reduce allergen levels. Giving your dog or cat a bath at least once a week can reduce airborne allergen.   Control of Dust Mite Allergen Dust mites play a major role in allergic asthma and rhinitis. They occur in environments with high humidity wherever human skin is found. Dust mites absorb humidity from the atmosphere (ie, they do not drink) and feed on organic matter (including shed human and animal skin). Dust mites are a microscopic type of insect that you cannot see with the naked eye. High levels of dust mites have been detected from mattresses, pillows, carpets, upholstered furniture, bed covers, clothes, soft toys and any woven material. The principal allergen of the dust mite is found in its feces. A gram of dust may contain 1,000 mites and 250,000 fecal particles. Mite antigen is easily measured in the air during house cleaning activities. Dust mites do not bite and do not cause harm to humans, other than by triggering allergies/asthma.  Ways to decrease your exposure to dust mites in your home:  1. Encase mattresses, box springs and pillows with a mite-impermeable barrier or cover  2. Wash sheets, blankets and drapes weekly in hot water (130 F) with detergent and dry them in a dryer on the hot setting.  3. Have the room cleaned frequently with a vacuum cleaner and a damp dust-mop. For carpeting or rugs, vacuuming with a vacuum cleaner equipped with a high-efficiency particulate air (HEPA) filter. The dust mite allergic individual should not be in a room which is being cleaned and should wait 1 hour after cleaning before going into the room.  4. Do not sleep on upholstered furniture (eg, couches).  5. If possible removing carpeting,  upholstered furniture and drapery from the home is ideal. Horizontal blinds should be eliminated in the rooms where the person spends the most time (bedroom, study, television room). Washable vinyl, roller-type shades are optimal.  6. Remove all non-washable stuffed toys from the bedroom. Wash stuffed toys weekly like sheets and blankets above.  7. Reduce indoor humidity to less than 50%. Inexpensive humidity monitors can be purchased at most hardware stores. Do not use a humidifier as can make the problem worse and are not recommended.

## 2022-05-19 ENCOUNTER — Encounter: Payer: Self-pay | Admitting: Family Medicine

## 2022-05-19 ENCOUNTER — Ambulatory Visit: Payer: Medicare HMO | Admitting: Family Medicine

## 2022-05-19 VITALS — BP 110/70 | HR 86 | Temp 98.6°F | Resp 18 | Ht 68.0 in | Wt 215.9 lb

## 2022-05-19 DIAGNOSIS — J302 Other seasonal allergic rhinitis: Secondary | ICD-10-CM

## 2022-05-19 DIAGNOSIS — L2084 Intrinsic (allergic) eczema: Secondary | ICD-10-CM

## 2022-05-19 DIAGNOSIS — J4541 Moderate persistent asthma with (acute) exacerbation: Secondary | ICD-10-CM

## 2022-05-19 DIAGNOSIS — K219 Gastro-esophageal reflux disease without esophagitis: Secondary | ICD-10-CM | POA: Diagnosis not present

## 2022-05-19 DIAGNOSIS — T7800XD Anaphylactic reaction due to unspecified food, subsequent encounter: Secondary | ICD-10-CM

## 2022-05-19 DIAGNOSIS — L501 Idiopathic urticaria: Secondary | ICD-10-CM

## 2022-05-19 DIAGNOSIS — T7800XA Anaphylactic reaction due to unspecified food, initial encounter: Secondary | ICD-10-CM

## 2022-05-19 DIAGNOSIS — J3089 Other allergic rhinitis: Secondary | ICD-10-CM | POA: Diagnosis not present

## 2022-05-19 MED ORDER — PREDNISONE 10 MG PO TABS: ORAL_TABLET | ORAL | 0 refills | Status: DC

## 2022-09-16 ENCOUNTER — Other Ambulatory Visit: Payer: Self-pay

## 2022-09-16 MED ORDER — EPINEPHRINE 0.3 MG/0.3ML IJ SOAJ: INTRAMUSCULAR | 1 refills | Status: DC

## 2022-09-16 NOTE — Telephone Encounter (Signed)
Patient calling needing an epipen 0.3mg  refill sent into her [facility]. Meds sent. Appointment already scheduled for August 6th with Thermon Leyland, fnp.

## 2022-10-27 ENCOUNTER — Encounter: Payer: Self-pay | Admitting: Family

## 2022-10-27 ENCOUNTER — Ambulatory Visit: Payer: Medicare HMO | Admitting: Family

## 2022-10-27 ENCOUNTER — Telehealth: Payer: Self-pay

## 2022-10-27 ENCOUNTER — Ambulatory Visit: Payer: Medicare HMO | Admitting: Family Medicine

## 2022-10-27 VITALS — BP 108/68 | HR 81 | Temp 97.0°F | Resp 18

## 2022-10-27 DIAGNOSIS — T7800XA Anaphylactic reaction due to unspecified food, initial encounter: Secondary | ICD-10-CM

## 2022-10-27 DIAGNOSIS — K219 Gastro-esophageal reflux disease without esophagitis: Secondary | ICD-10-CM

## 2022-10-27 DIAGNOSIS — J302 Other seasonal allergic rhinitis: Secondary | ICD-10-CM

## 2022-10-27 DIAGNOSIS — L501 Idiopathic urticaria: Secondary | ICD-10-CM

## 2022-10-27 DIAGNOSIS — J4541 Moderate persistent asthma with (acute) exacerbation: Secondary | ICD-10-CM | POA: Diagnosis not present

## 2022-10-27 DIAGNOSIS — J3089 Other allergic rhinitis: Secondary | ICD-10-CM

## 2022-10-27 DIAGNOSIS — L2084 Intrinsic (allergic) eczema: Secondary | ICD-10-CM

## 2022-10-27 MED ORDER — ALBUTEROL SULFATE (2.5 MG/3ML) 0.083% IN NEBU
2.5000 mg | INHALATION_SOLUTION | RESPIRATORY_TRACT | 1 refills | Status: DC | PRN
Start: 2022-10-27 — End: 2023-06-11

## 2022-10-27 MED ORDER — PULMICORT FLEXHALER 180 MCG/ACT IN AEPB: INHALATION_SPRAY | RESPIRATORY_TRACT | 5 refills | Status: DC

## 2022-10-27 NOTE — Patient Instructions (Addendum)
Asthma/cough Continue Pulmicort 180- 2 puffs twice a day with a spacer to prevent cough and wheeze.Rinse mouth out after.   Continue Singulair 10 mg once a day to help prevent cough and wheeze. May use albuterol 2 puffs every 4 hours as needed for cough, wheeze, tightness in chest or shortness of breath.   You may use albuterol 2 puffs 5 to 15 minutes prior to exercise  Asthma control goals:  Full participation in all desired activities (may need albuterol before activity) Albuterol use two time or less a week on average (not counting use with activity) Cough interfering with sleep two time or less a month Oral steroids no more than once a year No hospitalizations    Allergic rhinitis Continue allergen avoidance measures directed toward dust mite, cat, and pollen as listed below Continue Flonase 2 sprays each nostril once a day to help prevent stuffy nose.  In the right nostril, point the applicator out toward the right ear. In the left nostril, point the applicator out toward the left ear Consider saline nasal rinses as needed for nasal symptoms. Use this before any medicated nasal sprays for best result Continue Zyrtec (cetirizine) 10 mg once a day for a runny nose or itch.  For thick post nasal drainage, begin Mucinex [phone] mg twice a day and increase fluid intake as tolerated.  Consider updating your environmental allergy testing.  Remember to stop antihistamines for 3 days before your testing appointment.  After allergy testing, consider allergen immunotherapy if your symptoms are not well-controlled with the treatment plan as listed above  Reflux Continue dietary and lifestyle modifications as listed below Continue pantoprazole 40 mg once a day as you have been   Atopic dermatitis Continue a daily moisturizing routine Continue hydrocortisone 1% to red itchy areas on your face and neck Continue triamcinolone 0.1% ointment to red itchy areas below your face or neck twice a day as  needed. Do not use triamcinolone on face, neck, groin, or armpit region.   Urticaria Continue an antihistamine once a day as needed for itch.  He may take an additional antihistamine once a day as needed for breakthrough symptoms  Food allergy Avoid shellfish (shrimp, crab, lobster, oyster, scallops, clams, etc). In case of an allergic reaction, give Benadryl 50 mg  every 4 hours, and if life-threatening symptoms occur, inject with EpiPen 0.3 mg. Stressed the importance of not eating shellfish unless cleared by our office. Discussed the risk of anaphylaxis/death possible with ingestion We will get lab work to follow up on your shellfish allergy. We will call you with results once they are back Emergency action plan given  Call the clinic if this treatment plan is not working well for you  Follow up in 3-4 months or sooner if needed.    Reducing Pollen Exposure The American Academy of Allergy, Asthma and Immunology suggests the following steps to reduce your exposure to pollen during allergy seasons. Do not hang sheets or clothing out to dry; pollen may collect on these items. Do not mow lawns or spend time around freshly cut grass; mowing stirs up pollen. Keep windows closed at night.  Keep car windows closed while driving. Minimize morning activities outdoors, a time when pollen counts are usually at their highest. Stay indoors as much as possible when pollen counts or humidity is high and on windy days when pollen tends to remain in the air longer. Use air conditioning when possible.  Many air conditioners have filters that trap the pollen  spores. Use a HEPA room air filter to remove pollen form the indoor air you breathe.  Control of Dog or Cat Allergen Avoidance is the best way to manage a dog or cat allergy. If you have a dog or cat and are allergic to dog or cats, consider removing the dog or cat from the home. If you have a dog or cat but don't want to find it a new home, or if  your family wants a pet even though someone in the household is allergic, here are some strategies that may help keep symptoms at bay:  Keep the pet out of your bedroom and restrict it to only a few rooms. Be advised that keeping the dog or cat in only one room will not limit the allergens to that room. Don't pet, hug or kiss the dog or cat; if you do, wash your hands with soap and water. High-efficiency particulate air (HEPA) cleaners run continuously in a bedroom or living room can reduce allergen levels over time. Regular use of a high-efficiency vacuum cleaner or a central vacuum can reduce allergen levels. Giving your dog or cat a bath at least once a week can reduce airborne allergen.   Control of Dust Mite Allergen Dust mites play a major role in allergic asthma and rhinitis. They occur in environments with high humidity wherever human skin is found. Dust mites absorb humidity from the atmosphere (ie, they do not drink) and feed on organic matter (including shed human and animal skin). Dust mites are a microscopic type of insect that you cannot see with the naked eye. High levels of dust mites have been detected from mattresses, pillows, carpets, upholstered furniture, bed covers, clothes, soft toys and any woven material. The principal allergen of the dust mite is found in its feces. A gram of dust may contain 1,000 mites and 250,000 fecal particles. Mite antigen is easily measured in the air during house cleaning activities. Dust mites do not bite and do not cause harm to humans, other than by triggering allergies/asthma.  Ways to decrease your exposure to dust mites in your home:  1. Encase mattresses, box springs and pillows with a mite-impermeable barrier or cover  2. Wash sheets, blankets and drapes weekly in hot water (130 F) with detergent and dry them in a dryer on the hot setting.  3. Have the room cleaned frequently with a vacuum cleaner and a damp dust-mop. For carpeting or  rugs, vacuuming with a vacuum cleaner equipped with a high-efficiency particulate air (HEPA) filter. The dust mite allergic individual should not be in a room which is being cleaned and should wait 1 hour after cleaning before going into the room.  4. Do not sleep on upholstered furniture (eg, couches).  5. If possible removing carpeting, upholstered furniture and drapery from the home is ideal. Horizontal blinds should be eliminated in the rooms where the person spends the most time (bedroom, study, television room). Washable vinyl, roller-type shades are optimal.  6. Remove all non-washable stuffed toys from the bedroom. Wash stuffed toys weekly like sheets and blankets above.  7. Reduce indoor humidity to less than 50%. Inexpensive humidity monitors can be purchased at most hardware stores. Do not use a humidifier as can make the problem worse and are not recommended.

## 2022-10-27 NOTE — Telephone Encounter (Signed)
Please let her know that her insurance prefers arnuity. Please call her and find out what reactions she has had with the inhalers she has tried in the past

## 2022-10-27 NOTE — Progress Notes (Signed)
[address] [place] [address] Dept: [phone]  FOLLOW UP NOTE  Patient ID: Joyce Fox, female    DOB: 1960/05/05  Age: 62 y.o. MRN: [id number] Date of Office Visit: 10/27/2022  Assessment  Chief Complaint: Asthma  HPI Joyce Fox is a 62 year old female who presents today for follow-up of moderate persistent asthma with acute exacerbation, seasonal and perennial allergic rhinitis, gastroesophageal reflux disease, idiopathic urticaria, allergy with anaphylaxis due to food, and intrinsic atopic dermatitis.  She was last seen on May 19, 2022 by Thermon Leyland, FNP.  She denies any new diagnosis or surgery since her last office visit.  Asthma: She is currently taking Pulmicort 180 mcg 2 puffs twice a day with spacer, Singulair 10 mg once a day, and albuterol as needed.  She denies cough, wheeze, tightness in chest, shortness of breath, and nocturnal awakenings due to breathing problems.  Since her last office visit she has not required any systemic steroids or made any trips to the emergency room or urgent care due to breathing problems.  She reports the last time she had to use her albuterol was last month and it was twice during the month.  She is requesting a refill on her Pulmicort 180 mcg.  She reports that this is the only inhaler that works for her.  She reports in the past she has gone through different inhalers and they would cause various symptoms such as causing her skin to peel or they would "mess with her heart.".  Pulmicort is the only inhaler that has kept her out of the hospital.  Allergic rhinitis: She reports postnasal drip in the morning that causes her to cough, but goes away after she drinks and eats something and takes her medications.  She is currently using Flonase as needed and cetirizine daily.  She denies rhinorrhea and nasal congestion.  She has not been treated for any sinus infections since we last saw her.  Reflux is reported as controlled with  pantoprazole.  She denies heartburn or reflux symptoms.  Atopic dermatitis: She reports that sometimes she will have flareups on her face.  This has occurred since receiving the Moderna vaccine.  She uses hydrocortisone 1% cream as needed.  Discussed how triamcinolone 0.1% ointment is not to be used on the face, neck, groin or armpit region.  Urticaria is reported as controlled.  She is currently not having any hives.  She reports that her primary care physician refills the hydroxyzine prescription.  Food allergy: She reports that she has eaten 3-4 large shrimp since we last saw her without any problems.  The last time she ate shrimp was in May of this year.  She reports that she cooked them herself and got them from [employer].  Discussed the importance of avoiding shellfish until cleared of this allergy by our office.  Discussed risks such as anaphylaxis and death.  She reports her original reaction occurred when she was eating pierogi at the [employer] and the fish was potentially cross contaminated with shrimp.  She reports that it "messed up her up" and caused her tongue to swell.  She has not had to use her EpiPen since we last saw her.     Drug Allergies:  Allergies  Allergen Reactions   Acetaminophen Hives   Aspirin Other (See Comments)    Per allergy test   Chocolate Flavor Other (See Comments)    Rash in mouth   Other Hives    Pain medication,  pripoxyphone   Pineapple Other (See Comments)    Rash in mouth   Prunus Persica Other (See Comments)    Rash in mouth   Sulfamethoxazole Hives   Tomato Other (See Comments)    Rash in mouth   Bee Venom Rash and Hives   Covid-19 Mrna Vacc (Moderna) Rash   Latex Rash and Hives   Okra Rash   Penicillins Rash    Review of Systems: Negative except as per HPI   Physical Exam: BP 108/68   Pulse 81   Temp (!) 97 F (36.1 C) (Temporal)   Resp 18   SpO2 99%    Physical Exam Constitutional:      Appearance: Normal appearance.   HENT:     Head: Normocephalic and atraumatic.     Comments: Pharynx normal, eyes normal, ears normal, nose: Bilateral lower turbinates moderately edematous and slightly erythematous with no drainage noted    Right Ear: Tympanic membrane, ear canal and external ear normal.     Left Ear: Tympanic membrane, ear canal and external ear normal.     Mouth/Throat:     Mouth: Mucous membranes are moist.     Pharynx: Oropharynx is clear.  Eyes:     Conjunctiva/sclera: Conjunctivae normal.  Cardiovascular:     Rate and Rhythm: Regular rhythm.     Heart sounds: Normal heart sounds.  Pulmonary:     Effort: Pulmonary effort is normal.     Breath sounds: Normal breath sounds.     Comments: Lungs clear to auscultation Musculoskeletal:     Cervical back: Neck supple.  Skin:    General: Skin is warm.  Neurological:     Mental Status: She is alert and oriented to person, place, and time.  Psychiatric:        Mood and Affect: Mood normal.        Behavior: Behavior normal.        Thought Content: Thought content normal.        Judgment: Judgment normal.     Diagnostics:  none  Assessment and Plan: 1. Moderate persistent asthma with acute exacerbation   2. Allergy with anaphylaxis due to food   3. Seasonal and perennial allergic rhinitis   4. Gastroesophageal reflux disease, unspecified whether esophagitis present   5. Intrinsic atopic dermatitis   6. Idiopathic urticaria     Meds ordered this encounter  Medications   albuterol (PROVENTIL) (2.5 MG/3ML) 0.083% nebulizer solution    Sig: Take 3 mLs (2.5 mg total) by nebulization every 4 (four) hours as needed for wheezing or shortness of breath.    Dispense:  75 mL    Refill:  1   budesonide (PULMICORT FLEXHALER) 180 MCG/ACT inhaler    Sig: Inhale 2 puffs twice a day with a spacer to help prevent cough and wheeze.  Rinse mouth out afterwards    Dispense:  1 each    Refill:  5    Patient Instructions  Asthma/cough Continue Pulmicort  180- 2 puffs twice a day with a spacer to prevent cough and wheeze.Rinse mouth out after.   Continue Singulair 10 mg once a day to help prevent cough and wheeze. May use albuterol 2 puffs every 4 hours as needed for cough, wheeze, tightness in chest or shortness of breath.   You may use albuterol 2 puffs 5 to 15 minutes prior to exercise  Asthma control goals:  Full participation in all desired activities (may need albuterol before activity) Albuterol use two time  or less a week on average (not counting use with activity) Cough interfering with sleep two time or less a month Oral steroids no more than once a year No hospitalizations    Allergic rhinitis Continue allergen avoidance measures directed toward dust mite, cat, and pollen as listed below Continue Flonase 2 sprays each nostril once a day to help prevent stuffy nose.  In the right nostril, point the applicator out toward the right ear. In the left nostril, point the applicator out toward the left ear Consider saline nasal rinses as needed for nasal symptoms. Use this before any medicated nasal sprays for best result Continue Zyrtec (cetirizine) 10 mg once a day for a runny nose or itch.  For thick post nasal drainage, begin Mucinex [phone] mg twice a day and increase fluid intake as tolerated.  Consider updating your environmental allergy testing.  Remember to stop antihistamines for 3 days before your testing appointment.  After allergy testing, consider allergen immunotherapy if your symptoms are not well-controlled with the treatment plan as listed above  Reflux Continue dietary and lifestyle modifications as listed below Continue pantoprazole 40 mg once a day as you have been   Atopic dermatitis Continue a daily moisturizing routine Continue hydrocortisone 1% to red itchy areas on your face and neck Continue triamcinolone 0.1% ointment to red itchy areas below your face or neck twice a day as needed. Do not use triamcinolone  on face, neck, groin, or armpit region.   Urticaria Continue an antihistamine once a day as needed for itch.  He may take an additional antihistamine once a day as needed for breakthrough symptoms  Food allergy Avoid shellfish (shrimp, crab, lobster, oyster, scallops, clams, etc). In case of an allergic reaction, give Benadryl 50 mg  every 4 hours, and if life-threatening symptoms occur, inject with EpiPen 0.3 mg. Stressed the importance of not eating shellfish unless cleared by our office. Discussed the risk of anaphylaxis/death possible with ingestion We will get lab work to follow up on your shellfish allergy. We will call you with results once they are back Emergency action plan given  Call the clinic if this treatment plan is not working well for you  Follow up in 3-4 months or sooner if needed.    Reducing Pollen Exposure The American Academy of Allergy, Asthma and Immunology suggests the following steps to reduce your exposure to pollen during allergy seasons. Do not hang sheets or clothing out to dry; pollen may collect on these items. Do not mow lawns or spend time around freshly cut grass; mowing stirs up pollen. Keep windows closed at night.  Keep car windows closed while driving. Minimize morning activities outdoors, a time when pollen counts are usually at their highest. Stay indoors as much as possible when pollen counts or humidity is high and on windy days when pollen tends to remain in the air longer. Use air conditioning when possible.  Many air conditioners have filters that trap the pollen spores. Use a HEPA room air filter to remove pollen form the indoor air you breathe.  Control of Dog or Cat Allergen Avoidance is the best way to manage a dog or cat allergy. If you have a dog or cat and are allergic to dog or cats, consider removing the dog or cat from the home. If you have a dog or cat but don't want to find it a new home, or if your family wants a pet even though  someone in the household is allergic,  here are some strategies that may help keep symptoms at bay:  Keep the pet out of your bedroom and restrict it to only a few rooms. Be advised that keeping the dog or cat in only one room will not limit the allergens to that room. Don't pet, hug or kiss the dog or cat; if you do, wash your hands with soap and water. High-efficiency particulate air (HEPA) cleaners run continuously in a bedroom or living room can reduce allergen levels over time. Regular use of a high-efficiency vacuum cleaner or a central vacuum can reduce allergen levels. Giving your dog or cat a bath at least once a week can reduce airborne allergen.   Control of Dust Mite Allergen Dust mites play a major role in allergic asthma and rhinitis. They occur in environments with high humidity wherever human skin is found. Dust mites absorb humidity from the atmosphere (ie, they do not drink) and feed on organic matter (including shed human and animal skin). Dust mites are a microscopic type of insect that you cannot see with the naked eye. High levels of dust mites have been detected from mattresses, pillows, carpets, upholstered furniture, bed covers, clothes, soft toys and any woven material. The principal allergen of the dust mite is found in its feces. A gram of dust may contain 1,000 mites and 250,000 fecal particles. Mite antigen is easily measured in the air during house cleaning activities. Dust mites do not bite and do not cause harm to humans, other than by triggering allergies/asthma.  Ways to decrease your exposure to dust mites in your home:  1. Encase mattresses, box springs and pillows with a mite-impermeable barrier or cover  2. Wash sheets, blankets and drapes weekly in hot water (130 F) with detergent and dry them in a dryer on the hot setting.  3. Have the room cleaned frequently with a vacuum cleaner and a damp dust-mop. For carpeting or rugs, vacuuming with a vacuum cleaner  equipped with a high-efficiency particulate air (HEPA) filter. The dust mite allergic individual should not be in a room which is being cleaned and should wait 1 hour after cleaning before going into the room.  4. Do not sleep on upholstered furniture (eg, couches).  5. If possible removing carpeting, upholstered furniture and drapery from the home is ideal. Horizontal blinds should be eliminated in the rooms where the person spends the most time (bedroom, study, television room). Washable vinyl, roller-type shades are optimal.  6. Remove all non-washable stuffed toys from the bedroom. Wash stuffed toys weekly like sheets and blankets above.  7. Reduce indoor humidity to less than 50%. Inexpensive humidity monitors can be purchased at most hardware stores. Do not use a humidifier as can make the problem worse and are not recommended.  Return in about 3 months (around 01/27/2023), or if symptoms worsen or fail to improve.    Thank you for the opportunity to care for this patient.  Please do not hesitate to contact me with questions.  Nehemiah Settle, FNP [facility]

## 2022-10-27 NOTE — Telephone Encounter (Signed)
Pa please for pulmicort pt has tried and failed symbicort which causes dizzy spells and heart racing, and failed flovent that caused frequent yeast infections

## 2022-10-27 NOTE — Telephone Encounter (Signed)
Lm for pt to call us back need to know what she tried and failed and what symptoms she had

## 2022-10-27 NOTE — Telephone Encounter (Signed)
Pts insurance doesn't cover [employer] prefers arnuity advise to change

## 2022-10-29 ENCOUNTER — Telehealth: Payer: Self-pay

## 2022-10-29 ENCOUNTER — Other Ambulatory Visit (HOSPITAL_COMMUNITY): Payer: Self-pay

## 2022-10-29 NOTE — Telephone Encounter (Signed)
Pharmacy Patient Advocate Encounter  Received notification from [facility] that Prior Authorization for Pulmicort Flexhaler 180MCG/ACT aerosol powder has been APPROVED from 10-29-2022 to 03-23-2023. Ran test claim, Copay is $11.20. This test claim was processed through [facility]- copay amounts may vary at other pharmacies due to pharmacy/plan contracts, or as the patient moves through the different stages of their insurance plan.   PA #/Case ID/Reference #: [id number]

## 2022-10-29 NOTE — Telephone Encounter (Signed)
PA request has been Submitted. New Encounter created for follow up. For additional info see Pharmacy Prior Auth telephone encounter from 10-29-2022.

## 2022-10-29 NOTE — Telephone Encounter (Signed)
Pharmacy Patient Advocate Encounter   Received notification from Pt Calls Messages that prior authorization for Pulmicort Flexhaler 180MCG/ACT aerosol powder is required/requested.   Insurance verification completed.   The patient is insured through [place] .   Per test claim: PA required; PA submitted to [facility] via CoverMyMeds Key/confirmation #/[id number] Status is pending

## 2022-10-30 MED ORDER — PULMICORT FLEXHALER 180 MCG/ACT IN AEPB: INHALATION_SPRAY | RESPIRATORY_TRACT | 5 refills | Status: DC

## 2022-10-30 NOTE — Telephone Encounter (Signed)
PHARMACY NOTIFIED.

## 2022-11-09 ENCOUNTER — Telehealth: Payer: Self-pay | Admitting: Family

## 2022-11-09 MED ORDER — PULMICORT FLEXHALER 180 MCG/ACT IN AEPB: INHALATION_SPRAY | RESPIRATORY_TRACT | 5 refills | Status: DC

## 2022-11-09 NOTE — Telephone Encounter (Signed)
Pt informed of me sending in pulmicort and verified pharmacy

## 2022-11-09 NOTE — Telephone Encounter (Signed)
Patient states she has been approved by [facility] for pulmicort and would like a refill.

## 2023-04-29 ENCOUNTER — Other Ambulatory Visit: Payer: Self-pay | Admitting: Family

## 2023-06-09 ENCOUNTER — Telehealth: Payer: Self-pay

## 2023-06-09 NOTE — Telephone Encounter (Signed)
*  Asthma/Allergy  Pharmacy Patient Advocate Encounter   Received notification from Fax that prior authorization for Pulmicort Flexhaler is required/requested.   Insurance verification completed.   The patient is insured through [place] .   Per test claim: PA required; PA submitted to above mentioned insurance via Fax Key/confirmation #/EOC N/A Status is pending

## 2023-06-10 ENCOUNTER — Other Ambulatory Visit (HOSPITAL_COMMUNITY): Payer: Self-pay

## 2023-06-10 ENCOUNTER — Other Ambulatory Visit: Payer: Self-pay

## 2023-06-10 ENCOUNTER — Telehealth: Payer: Self-pay

## 2023-06-10 NOTE — Telephone Encounter (Signed)
 Pharmacy Patient Advocate Encounter  Received notification from [facility] that Prior Authorization for Pulmicort Flexhaler has been APPROVED from 06/09/2023 to 03/22/2024. Ran test claim, Copay is $12.15. This test claim was processed through [facility]- copay amounts may vary at other pharmacies due to pharmacy/plan contracts, or as the patient moves through the different stages of their insurance plan.

## 2023-06-10 NOTE — Telephone Encounter (Signed)
 Received fax refill request for albuterol, pt was called and appt schedule with chrissy for tomorrow @ 11 am, for refill approval and asthma follow up.

## 2023-06-11 ENCOUNTER — Encounter: Payer: Self-pay | Admitting: Family

## 2023-06-11 ENCOUNTER — Ambulatory Visit: Admitting: Family

## 2023-06-11 VITALS — BP 136/86 | HR 80 | Temp 97.9°F | Resp 20 | Wt >= 6400 oz

## 2023-06-11 DIAGNOSIS — L2084 Intrinsic (allergic) eczema: Secondary | ICD-10-CM

## 2023-06-11 DIAGNOSIS — T7800XD Anaphylactic reaction due to unspecified food, subsequent encounter: Secondary | ICD-10-CM

## 2023-06-11 DIAGNOSIS — J4540 Moderate persistent asthma, uncomplicated: Secondary | ICD-10-CM | POA: Diagnosis not present

## 2023-06-11 DIAGNOSIS — J302 Other seasonal allergic rhinitis: Secondary | ICD-10-CM

## 2023-06-11 DIAGNOSIS — J3089 Other allergic rhinitis: Secondary | ICD-10-CM | POA: Diagnosis not present

## 2023-06-11 DIAGNOSIS — K219 Gastro-esophageal reflux disease without esophagitis: Secondary | ICD-10-CM | POA: Diagnosis not present

## 2023-06-11 DIAGNOSIS — L501 Idiopathic urticaria: Secondary | ICD-10-CM

## 2023-06-11 DIAGNOSIS — T7800XA Anaphylactic reaction due to unspecified food, initial encounter: Secondary | ICD-10-CM

## 2023-06-11 MED ORDER — PULMICORT FLEXHALER 180 MCG/ACT IN AEPB: INHALATION_SPRAY | RESPIRATORY_TRACT | 5 refills | Status: AC

## 2023-06-11 MED ORDER — ALBUTEROL SULFATE (2.5 MG/3ML) 0.083% IN NEBU: 2.5000 mg | INHALATION_SOLUTION | RESPIRATORY_TRACT | 1 refills | Status: AC | PRN

## 2023-06-11 MED ORDER — EPINEPHRINE 0.3 MG/0.3ML IJ SOAJ: INTRAMUSCULAR | 1 refills | Status: DC

## 2023-06-11 MED ORDER — ALBUTEROL SULFATE HFA 108 (90 BASE) MCG/ACT IN AERS: 2.0000 | INHALATION_SPRAY | RESPIRATORY_TRACT | 1 refills | Status: DC | PRN

## 2023-06-11 NOTE — Patient Instructions (Addendum)
 Asthma/cough Continue Pulmicort 180- 2 puffs twice a day with a spacer to prevent cough and wheeze.Rinse mouth out after.   Continue Singulair 10 mg once a day to help prevent cough and wheeze. May use albuterol 2 puffs every 4 hours as needed for cough, wheeze, tightness in chest or shortness of breath.   You may use albuterol 2 puffs 5 to 15 minutes prior to exercise  Asthma control goals:  Full participation in all desired activities (may need albuterol before activity) Albuterol use two time or less a week on average (not counting use with activity) Cough interfering with sleep two time or less a month Oral steroids no more than once a year No hospitalizations    Allergic rhinitis Continue allergen avoidance measures directed toward dust mite, cat, and pollen as listed below Continue Flonase 2 sprays each nostril once a day to help prevent stuffy nose.  In the right nostril, point the applicator out toward the right ear. In the left nostril, point the applicator out toward the left ear Consider saline nasal rinses as needed for nasal symptoms. Use this before any medicated nasal sprays for best result Continue Zyrtec (cetirizine) 10 mg once a day for a runny nose or itch.  For thick post nasal drainage, begin Mucinex [phone] mg twice a day and increase fluid intake as tolerated.  Consider updating your environmental allergy testing.  Remember to stop antihistamines for 3 days before your testing appointment.  After allergy testing, consider allergen immunotherapy if your symptoms are not well-controlled with the treatment plan as listed above  Reflux Continue dietary and lifestyle modifications as listed below Continue pantoprazole 40 mg once a day as you have been   Atopic dermatitis Continue a daily moisturizing routine Continue hydrocortisone 1% to red itchy areas on your face and neck Continue triamcinolone 0.1% ointment to red itchy areas below your face or neck twice a day as  needed. Do not use triamcinolone on face, neck, groin, or armpit region.   Urticaria Continue an antihistamine once a day as needed for itch.  He may take an additional antihistamine once a day as needed for breakthrough symptoms  Food allergy Avoid shellfish (shrimp, crab, lobster, oyster, scallops, clams, etc). In case of an allergic reaction, give Benadryl 50 mg  every 4 hours, and if life-threatening symptoms occur, inject with EpiPen 0.3 mg. Follow Emergency action plan   Call the clinic if this treatment plan is not working well for you  Follow up in 6 months or sooner if needed.    Reducing Pollen Exposure The American Academy of All[employer], Asthma and Immunology suggests the following steps to reduce your exposure to pollen during allergy seasons. Do not hang sheets or clothing out to dry; pollen may collect on these items. Do not mow lawns or spend time around freshly cut grass; mowing stirs up pollen. Keep windows closed at night.  Keep car windows closed while driving. Minimize morning activities outdoors, a time when pollen counts are usually at their highest. Stay indoors as much as possible when pollen counts or humidity is high and on windy days when pollen tends to remain in the air longer. Use air conditioning when possible.  Many air conditioners have filters that trap the pollen spores. Use a HEPA room air filter to remove pollen form the indoor air you breathe.  Control of Dog or Cat Allergen Avoidance is the best way to manage a dog or cat allergy. If you have a dog or  cat and are allergic to dog or cats, consider removing the dog or cat from the home. If you have a dog or cat but don't want to find it a new home, or if your family wants a pet even though someone in the household is allergic, here are some strategies that may help keep symptoms at bay:  Keep the pet out of your bedroom and restrict it to only a few rooms. Be advised that keeping the dog or cat in only  one room will not limit the allergens to that room. Don't pet, hug or kiss the dog or cat; if you do, wash your hands with soap and water. High-efficiency particulate air (HEPA) cleaners run continuously in a bedroom or living room can reduce allergen levels over time. Regular use of a high-efficiency vacuum cleaner or a central vacuum can reduce allergen levels. Giving your dog or cat a bath at least once a week can reduce airborne allergen.   Control of Dust Mite Allergen Dust mites play a major role in allergic asthma and rhinitis. They occur in environments with high humidity wherever human skin is found. Dust mites absorb humidity from the atmosphere (ie, they do not drink) and feed on organic matter (including shed human and animal skin). Dust mites are a microscopic type of insect that you cannot see with the naked eye. High levels of dust mites have been detected from mattresses, pillows, carpets, upholstered furniture, bed covers, clothes, soft toys and any woven material. The principal allergen of the dust mite is found in its feces. A gram of dust may contain 1,000 mites and 250,000 fecal particles. Mite antigen is easily measured in the air during house cleaning activities. Dust mites do not bite and do not cause harm to humans, other than by triggering allergies/asthma.  Ways to decrease your exposure to dust mites in your home:  1. Encase mattresses, box springs and pillows with a mite-impermeable barrier or cover  2. Wash sheets, blankets and drapes weekly in hot water (130 F) with detergent and dry them in a dryer on the hot setting.  3. Have the room cleaned frequently with a vacuum cleaner and a damp dust-mop. For carpeting or rugs, vacuuming with a vacuum cleaner equipped with a high-efficiency particulate air (HEPA) filter. The dust mite allergic individual should not be in a room which is being cleaned and should wait 1 hour after cleaning before going into the room.  4. Do  not sleep on upholstered furniture (eg, couches).  5. If possible removing carpeting, upholstered furniture and drapery from the home is ideal. Horizontal blinds should be eliminated in the rooms where the person spends the most time (bedroom, study, television room). Washable vinyl, roller-type shades are optimal.  6. Remove all non-washable stuffed toys from the bedroom. Wash stuffed toys weekly like sheets and blankets above.  7. Reduce indoor humidity to less than 50%. Inexpensive humidity monitors can be purchased at most hardware stores. Do not use a humidifier as can make the problem worse and are not recommended.

## 2023-06-11 NOTE — Progress Notes (Signed)
 [address] [place] [address] Dept: [phone]  FOLLOW UP NOTE  Patient ID: Joyce Fox, female    DOB: 1960-05-18  Age: 63 y.o. MRN: [id number] Date of Office Visit: 06/11/2023  Assessment  Chief Complaint: Medication Refill (Follow up inhaler refills)  HPI Joyce Fox is a 63 year old female who presents today for follow-up of moderate persistent asthma, allergy with anaphylaxis due to food, seasonal and perennial allergic rhinitis, gastroesophageal reflux disease, intrinsic atopic dermatitis, and idiopathic urticaria.  She was last seen by myself on October 27, 2022.  She denies any new surgery since her last office visit.  She does mention that they changed her medicine for her headaches and she has been on this new medication for 10 days because they thought it could be causing her gums on her bottom teeth to swell and bleed.  She mentions that she has been to her dentist and was told that she did not have any bad teeth.  Her gums are not bleeding now, but are still swollen  Asthma: She is currently taking Pulmicort 180 mcg 2 puffs twice a day with spacer, Singulair 10 mg once a day, and albuterol as needed.  She reports a little bit of cough due to drainage and otherwise denies wheezing, tightness in chest, shortness of breath, and nocturnal awakenings due to breathing problems.  Since her last office visit she has not required any systemic steroids or made any trips to the emergency room or urgent care due to breathing problems.  She last used her albuterol inhaler last week when the wind was blowing hard.  Other than that she feels like her asthma has been doing well.  Allergic rhinitis: She reports postnasal drip since the weather change.  She denies rhinorrhea, nasal congestion, and sinus pressure.  She has not been treated for any sinus infections since we last saw her.  She takes Zyrtec 10 mg in the morning, Singulair 10 mg at night, and fluticasone nasal spray as  needed.  Reflux: She reports her reflux has been good.  She continues to take pantoprazole 40 mg once a day.  Atopic dermatitis: She reports ever since she had the Moderna injection in April 2020 that her face will broke out and will break out every 3 to 4 months.  She reports that her primary care physician prescribed a medicine that was okay for her face and body.  She is not certain the name of this medication.  Urticaria: She reports that she does not have any hives.  She does take hydroxyzine.  Food allergy: She continues to avoid shellfish without any accidental ingestion or use of her epinephrine autoinjector device.  She reports that she did have a shrimp roll, but it did not have shrimp in it.  She mentions that she has how to use her epinephrine autoinjector device.   Drug Allergies:  Allergies  Allergen Reactions   Acetaminophen Hives   Aspirin Other (See Comments)    Per allergy test   Chocolate Flavoring Agent (Non-Screening) Other (See Comments)    Rash in mouth   Other Hives    Pain medication, pripoxyphone   Pineapple Other (See Comments)    Rash in mouth   Prunus Persica Other (See Comments)    Rash in mouth   Sulfamethoxazole Hives   Tomato Other (See Comments)    Rash in mouth   Bee Venom Rash and Hives   Covid-19 Mrna Vacc (Moderna) Rash   Latex  Rash and Hives   Okra Rash   Penicillins Rash    Review of Systems: Negative except as per HPI   Physical Exam: BP 136/86   Pulse 80   Temp 97.9 F (36.6 C) (Temporal)   Resp 20   Wt (!) 1224 lb 9.6 oz (555.5 kg)   SpO2 99%   BMI 186.20 kg/m    Physical Exam Constitutional:      Appearance: Normal appearance.  HENT:     Head: Normocephalic and atraumatic.     Comments: Pharynx normal. Eyes normal. Ears normal. Nose: bilateral lower turbinates mildly edematous with no drainage noted.    Right Ear: Tympanic membrane, ear canal and external ear normal.     Left Ear: Tympanic membrane, ear canal and  external ear normal.     Mouth/Throat:     Mouth: Mucous membranes are moist.     Pharynx: Oropharynx is clear.  Eyes:     Conjunctiva/sclera: Conjunctivae normal.  Cardiovascular:     Rate and Rhythm: Regular rhythm.  Pulmonary:     Effort: Pulmonary effort is normal.     Breath sounds: Normal breath sounds.     Comments: Lungs clear to auscultation Musculoskeletal:     Cervical back: Neck supple.  Skin:    General: Skin is warm.  Neurological:     Mental Status: She is alert and oriented to person, place, and time.  Psychiatric:        Mood and Affect: Mood normal.        Behavior: Behavior normal.        Thought Content: Thought content normal.        Judgment: Judgment normal.     Diagnostics: FVC 1.50 L (51%), FEV1 1.40 L (61%), FEV1/FVC 0.93.  Predicted FVC 2.95 L, predicted FEV1 2.31 L.  Spirometry indicates possible moderate restriction.  Assessment and Plan: 1. Seasonal and perennial allergic rhinitis   2. Moderate persistent asthma without complication   3. Allergy with anaphylaxis due to food   4. Gastroesophageal reflux disease, unspecified whether esophagitis present   5. Intrinsic atopic dermatitis   6. Idiopathic urticaria     No orders of the defined types were placed in this encounter.   Patient Instructions  Asthma/cough Continue Pulmicort 180- 2 puffs twice a day with a spacer to prevent cough and wheeze.Rinse mouth out after.   Continue Singulair 10 mg once a day to help prevent cough and wheeze. May use albuterol 2 puffs every 4 hours as needed for cough, wheeze, tightness in chest or shortness of breath.   You may use albuterol 2 puffs 5 to 15 minutes prior to exercise  Asthma control goals:  Full participation in all desired activities (may need albuterol before activity) Albuterol use two time or less a week on average (not counting use with activity) Cough interfering with sleep two time or less a month Oral steroids no more than once a  year No hospitalizations    Allergic rhinitis Continue allergen avoidance measures directed toward dust mite, cat, and pollen as listed below Continue Flonase 2 sprays each nostril once a day to help prevent stuffy nose.  In the right nostril, point the applicator out toward the right ear. In the left nostril, point the applicator out toward the left ear Consider saline nasal rinses as needed for nasal symptoms. Use this before any medicated nasal sprays for best result Continue Zyrtec (cetirizine) 10 mg once a day for a runny nose or itch.  For thick post nasal drainage, begin Mucinex [phone] mg twice a day and increase fluid intake as tolerated.  Consider updating your environmental allergy testing.  Remember to stop antihistamines for 3 days before your testing appointment.  After allergy testing, consider allergen immunotherapy if your symptoms are not well-controlled with the treatment plan as listed above  Reflux Continue dietary and lifestyle modifications as listed below Continue pantoprazole 40 mg once a day as you have been   Atopic dermatitis Continue a daily moisturizing routine Continue hydrocortisone 1% to red itchy areas on your face and neck Continue triamcinolone 0.1% ointment to red itchy areas below your face or neck twice a day as needed. Do not use triamcinolone on face, neck, groin, or armpit region.   Urticaria Continue an antihistamine once a day as needed for itch.  He may take an additional antihistamine once a day as needed for breakthrough symptoms  Food allergy Avoid shellfish (shrimp, crab, lobster, oyster, scallops, clams, etc). In case of an allergic reaction, give Benadryl 50 mg  every 4 hours, and if life-threatening symptoms occur, inject with EpiPen 0.3 mg. Follow Emergency action plan   Call the clinic if this treatment plan is not working well for you  Follow up in 6 months or sooner if needed.    Reducing Pollen Exposure The American Academy  of Allergy, Asthma and Immunology suggests the following steps to reduce your exposure to pollen during allergy seasons. Do not hang sheets or clothing out to dry; pollen may collect on these items. Do not mow lawns or spend time around freshly cut grass; mowing stirs up pollen. Keep windows closed at night.  Keep car windows closed while driving. Minimize morning activities outdoors, a time when pollen counts are usually at their highest. Stay indoors as much as possible when pollen counts or humidity is high and on windy days when pollen tends to remain in the air longer. Use air conditioning when possible.  Many air conditioners have filters that trap the pollen spores. Use a HEPA room air filter to remove pollen form the indoor air you breathe.  Control of Dog or Cat Allergen Avoidance is the best way to manage a dog or cat allergy. If you have a dog or cat and are allergic to dog or cats, consider removing the dog or cat from the home. If you have a dog or cat but don't want to find it a new home, or if your family wants a pet even though someone in the household is allergic, here are some strategies that may help keep symptoms at bay:  Keep the pet out of your bedroom and restrict it to only a few rooms. Be advised that keeping the dog or cat in only one room will not limit the allergens to that room. Don't pet, hug or kiss the dog or cat; if you do, wash your hands with soap and water. High-efficiency particulate air (HEPA) cleaners run continuously in a bedroom or living room can reduce allergen levels over time. Regular use of a high-efficiency vacuum cleaner or a central vacuum can reduce allergen levels. Giving your dog or cat a bath at least once a week can reduce airborne allergen.   Control of Dust Mite Allergen Dust mites play a major role in allergic asthma and rhinitis. They occur in environments with high humidity wherever human skin is found. Dust mites absorb humidity from  the atmosphere (ie, they do not drink) and feed on organic matter (including  shed human and animal skin). Dust mites are a microscopic type of insect that you cannot see with the naked eye. High levels of dust mites have been detected from mattresses, pillows, carpets, upholstered furniture, bed covers, clothes, soft toys and any woven material. The principal allergen of the dust mite is found in its feces. A gram of dust may contain 1,000 mites and 250,000 fecal particles. Mite antigen is easily measured in the air during house cleaning activities. Dust mites do not bite and do not cause harm to humans, other than by triggering allergies/asthma.  Ways to decrease your exposure to dust mites in your home:  1. Encase mattresses, box springs and pillows with a mite-impermeable barrier or cover  2. Wash sheets, blankets and drapes weekly in hot water (130 F) with detergent and dry them in a dryer on the hot setting.  3. Have the room cleaned frequently with a vacuum cleaner and a damp dust-mop. For carpeting or rugs, vacuuming with a vacuum cleaner equipped with a high-efficiency particulate air (HEPA) filter. The dust mite allergic individual should not be in a room which is being cleaned and should wait 1 hour after cleaning before going into the room.  4. Do not sleep on upholstered furniture (eg, couches).  5. If possible removing carpeting, upholstered furniture and drapery from the home is ideal. Horizontal blinds should be eliminated in the rooms where the person spends the most time (bedroom, study, television room). Washable vinyl, roller-type shades are optimal.  6. Remove all non-washable stuffed toys from the bedroom. Wash stuffed toys weekly like sheets and blankets above.  7. Reduce indoor humidity to less than 50%. Inexpensive humidity monitors can be purchased at most hardware stores. Do not use a humidifier as can make the problem worse and are not recommended.  Return in about 6  months (around 12/12/2023).    Thank you for the opportunity to care for this patient.  Please do not hesitate to contact me with questions.  Nehemiah Settle, FNP [facility]

## 2023-06-11 NOTE — Telephone Encounter (Signed)
 Noted. Thanks.

## 2023-07-22 ENCOUNTER — Telehealth: Payer: Self-pay | Admitting: Family

## 2023-07-22 NOTE — Telephone Encounter (Signed)
 Joyce Fox called and stated that she is having trouble breathing and has been on her machine every 4 hours. She would like to know if something can be called in for her.

## 2023-07-22 NOTE — Telephone Encounter (Signed)
 Called patient she is having increased mucus cousing cough denies shortes of breath not using inhaler only nebulizer every 4 hours advised ed visit if symptoms increase. Scheduled ov tomorrow 9am with NP

## 2023-07-23 ENCOUNTER — Other Ambulatory Visit: Payer: Self-pay

## 2023-07-23 ENCOUNTER — Ambulatory Visit (INDEPENDENT_AMBULATORY_CARE_PROVIDER_SITE_OTHER): Admitting: Family

## 2023-07-23 ENCOUNTER — Encounter: Payer: Self-pay | Admitting: Family

## 2023-07-23 VITALS — BP 136/88 | HR 123 | Temp 97.9°F | Resp 22 | Wt 219.5 lb

## 2023-07-23 DIAGNOSIS — J4541 Moderate persistent asthma with (acute) exacerbation: Secondary | ICD-10-CM | POA: Diagnosis not present

## 2023-07-23 DIAGNOSIS — R490 Dysphonia: Secondary | ICD-10-CM

## 2023-07-23 DIAGNOSIS — L501 Idiopathic urticaria: Secondary | ICD-10-CM

## 2023-07-23 DIAGNOSIS — L2084 Intrinsic (allergic) eczema: Secondary | ICD-10-CM

## 2023-07-23 DIAGNOSIS — K219 Gastro-esophageal reflux disease without esophagitis: Secondary | ICD-10-CM

## 2023-07-23 DIAGNOSIS — J3089 Other allergic rhinitis: Secondary | ICD-10-CM

## 2023-07-23 DIAGNOSIS — T7800XA Anaphylactic reaction due to unspecified food, initial encounter: Secondary | ICD-10-CM

## 2023-07-23 DIAGNOSIS — T7800XD Anaphylactic reaction due to unspecified food, subsequent encounter: Secondary | ICD-10-CM

## 2023-07-23 DIAGNOSIS — J302 Other seasonal allergic rhinitis: Secondary | ICD-10-CM

## 2023-07-23 MED ORDER — PREDNISONE 10 MG PO TABS
ORAL_TABLET | ORAL | 0 refills | Status: AC
Start: 2023-07-23 — End: ?

## 2023-07-23 NOTE — Progress Notes (Signed)
 [address] [place] [address] Dept: [phone]  FOLLOW UP NOTE  Patient ID: Joyce Fox, female    DOB: 1960/11/28  Age: 63 y.o. MRN: [id number] Date of Office Visit: 07/23/2023  Assessment  Chief Complaint: Cough (Same day cough with mucus clear and thick. She used neb at 5am)  HPI Joyce Fox is a 63-year female who presents today for an acute visit of cough.  She was last seen by myself on June 11, 2023 for seasonal and perennial allergic rhinitis, moderate persistent asthma without complication, allergy with anaphylaxis due to food, gastroesophageal reflux disease, intrinsic atopic dermatitis, and idiopathic urticaria.  She denies any new diagnosis or surgery since her last office visit.  She reports on Wednesday she started having runny nose, stuffy nose, and drainage. She then developed a cough.  The cough can be productive at times.  What she is able to cough up is thick and clear in color.  She does not have postnasal drip, rhinorrhea, or nasal congestion now since using her breathing machine.  She reports that she cannot sleep due to the cough and is tired because she could not sleep last night.  She reports shortness of breath only when coughing.  She denies wheezing, tightness in chest, fever, chills, and bodyaches.  She does mention her back feels a little bit sore this morning from the position of her body while coughing.  She mentions that her asthma tends to flare when seasons change.  She has not been on any steroids this year.  Since her last office visit she has not required any systemic steroids or made any trips to the emergency room or urgent care due to breathing problems.  She has been using her albuterol  via nebulizer every 4 hours and it does help slow down the cough.  She used her albuterol  inhaler once on Wednesday once on Thursday.  She does continue to take Pulmicort  180 mcg 2 puffs twice a day and Singulair  10 mg daily.  She reports that this feels  like her asthma is flaring because she always will lose her voice  Allergic rhinitis: She reports that on Wednesday she did have rhinorrhea, nasal congestion, postnasal drip, but this has since gone away with the breathing treatments.  Her throat is a little bit sore.  She uses Flonase  nasal spray as needed and takes Zyrtec  10 mg daily.  Last night she did take 1 Mucinex and mentions that it helped break things up.  She also mentioned that drinking [place] tea helped break it up this morning also.  Offered to send in azelastine nasal spray to help with drainage, but she feels like it is not drainage.  Reflux: She continues to take pantoprazole  40 mg once a day.  She denies any heartburn or reflux symptoms.  She mentions that she feels something sitting there.  Discussed that this could be her reflux. She feels like it is her asthma flaring  Atopic dermatitis: She reports that her skin broke out after having the Moderna injection.  She has hydrocortisone to use as needed and triamcinolone.  She has not had any urticarial lesions since we last saw her.  Allergy: She continues to avoid shellfish without any accidental ingestion or use of her epinephrine  autoinjector device.   Drug Allergies:  Allergies  Allergen Reactions   Acetaminophen  Hives   Aspirin Other (See Comments)    Per allergy test   Chocolate Flavoring Agent (Non-Screening) Other (See Comments)  Rash in mouth   Other Hives    Pain medication, pripoxyphone   Pineapple Other (See Comments)    Rash in mouth   Prunus Persica Other (See Comments)    Rash in mouth   Sulfamethoxazole Hives   Tomato Other (See Comments)    Rash in mouth   Bee Venom Rash and Hives   Covid-19 Mrna Vacc (Moderna) Rash   Latex Rash and Hives   Okra Rash   Penicillins Rash    Review of Systems: Negative except as per HPI   Physical Exam: BP 136/88   Pulse (!) 123   Temp 97.9 F (36.6 C) (Temporal)   Resp (!) 22   Wt 219 lb 8 oz (99.6 kg)    SpO2 96%   BMI 33.37 kg/m    Physical Exam Constitutional:      Appearance: Normal appearance.  HENT:     Head: Normocephalic and atraumatic.     Comments: Pharynx normal, eyes normal, ears normal, nose: Bilateral lower turbinates moderately edematous and slightly erythematous with no drainage noted    Right Ear: Tympanic membrane, ear canal and external ear normal.     Left Ear: Tympanic membrane, ear canal and external ear normal.     Mouth/Throat:     Mouth: Mucous membranes are moist.     Pharynx: Oropharynx is clear.  Eyes:     Conjunctiva/sclera: Conjunctivae normal.  Cardiovascular:     Rate and Rhythm: Regular rhythm.     Heart sounds: Normal heart sounds.  Pulmonary:     Effort: Pulmonary effort is normal.     Breath sounds: Normal breath sounds.     Comments: Lungs clear to auscultation Musculoskeletal:     Cervical back: Neck supple.  Skin:    General: Skin is warm.  Neurological:     Mental Status: She is alert and oriented to person, place, and time.  Psychiatric:        Mood and Affect: Mood normal.        Behavior: Behavior normal.        Thought Content: Thought content normal.        Judgment: Judgment normal.     Diagnostics: FVC 1.40 L (47%), FEV1 1.22 L (53%), FEV1/FVC 0.87.  Predicted FVC 2.95 L, predicted FEV1 2.30 L.  Spirometry indicates possibly moderate severe restriction.  FEV1 is decreased from last office visit.  Assessment and Plan: 1. Seasonal and perennial allergic rhinitis   2. Moderate persistent asthma with acute exacerbation   3. Hoarseness of voice   4. Gastroesophageal reflux disease, unspecified whether esophagitis present   5. Allergy with anaphylaxis due to food   6. Intrinsic atopic dermatitis   7. Idiopathic urticaria     Meds ordered this encounter  Medications   predniSONE  (DELTASONE ) 10 MG tablet    Sig: Take 2 tablets twice a day for 3 days, then on the fourth day take 2 tablets in the morning, and on the fifth day  take 1 tablet and stop    Dispense:  15 tablet    Refill:  0    Patient Instructions  Asthma/cough-with acute exacerbation Start prednisone  taking 2 tablets twice a day for 3 days, then on the fourth day take 2 tablets in the morning, and on the fifth day take 1 tablet and stop.  If your symptoms do not get better please go to the emergency room or urgent care. Continue Pulmicort  180- 2 puffs twice a day to prevent  cough and wheeze.Rinse mouth out after.   Continue Singulair  10 mg once a day to help prevent cough and wheeze. May use albuterol  2 puffs every 4 hours as needed for cough, wheeze, tightness in chest or shortness of breath.   You may use albuterol  2 puffs 5 to 15 minutes prior to exercise  Asthma control goals:  Full participation in all desired activities (may need albuterol  before activity) Albuterol  use two time or less a week on average (not counting use with activity) Cough interfering with sleep two time or less a month Oral steroids no more than once a year No hospitalizations    Allergic rhinitis Continue allergen avoidance measures directed toward dust mite, cat, and pollen as listed below Continue Flonase  2 sprays each nostril once a day to help prevent stuffy nose.  In the right nostril, point the applicator out toward the right ear. In the left nostril, point the applicator out toward the left ear Consider saline nasal rinses as needed for nasal symptoms. Use this before any medicated nasal sprays for best result Continue Zyrtec  (cetirizine ) 10 mg once a day for a runny nose or itch.  For thick post nasal drainage, begin Mucinex [phone] mg twice a day and increase fluid intake as tolerated.  Consider updating your environmental allergy testing.  Remember to stop antihistamines for 3 days before your testing appointment.  After allergy testing, consider allergen immunotherapy if your symptoms are not well-controlled with the treatment plan as listed  above  Reflux Continue dietary and lifestyle modifications as listed below Continue pantoprazole  40 mg once a day as you have been   Atopic dermatitis Continue a daily moisturizing routine Continue hydrocortisone 1% to red itchy areas on your face and neck Continue triamcinolone 0.1% ointment to red itchy areas below your face or neck twice a day as needed. Do not use triamcinolone on face, neck, groin, or armpit region.   Urticaria Continue an antihistamine once a day as needed for itch.  He may take an additional antihistamine once a day as needed for breakthrough symptoms  Food allergy Avoid shellfish (shrimp, crab, lobster, oyster, scallops, clams, etc). In case of an allergic reaction, give Benadryl  50 mg  every 4 hours, and if life-threatening symptoms occur, inject with EpiPen  0.3 mg. Follow Emergency action plan   Call the clinic if this treatment plan is not working well for you  Keep already scheduled follow-up appointment on December 13, 2023 at 11 AM or sooner if needed.    Reducing Pollen Exposure The American Academy of Allergy, Asthma and Immunology suggests the following steps to reduce your exposure to pollen during allergy seasons. Do not hang sheets or clothing out to dry; pollen may collect on these items. Do not mow lawns or spend time around freshly cut grass; mowing stirs up pollen. Keep windows closed at night.  Keep car windows closed while driving. Minimize morning activities outdoors, a time when pollen counts are usually at their highest. Stay indoors as much as possible when pollen counts or humidity is high and on windy days when pollen tends to remain in the air longer. Use air conditioning when possible.  Many air conditioners have filters that trap the pollen spores. Use a HEPA room air filter to remove pollen form the indoor air you breathe.  Control of Dog or Cat Allergen Avoidance is the best way to manage a dog or cat allergy. If you have a dog  or cat and are allergic to dog  or cats, consider removing the dog or cat from the home. If you have a dog or cat but don't want to find it a new home, or if your family wants a pet even though someone in the household is allergic, here are some strategies that may help keep symptoms at bay:  Keep the pet out of your bedroom and restrict it to only a few rooms. Be advised that keeping the dog or cat in only one room will not limit the allergens to that room. Don't pet, hug or kiss the dog or cat; if you do, wash your hands with soap and water. High-efficiency particulate air (HEPA) cleaners run continuously in a bedroom or living room can reduce allergen levels over time. Regular use of a high-efficiency vacuum cleaner or a central vacuum can reduce allergen levels. Giving your dog or cat a bath at least once a week can reduce airborne allergen.   Control of Dust Mite Allergen Dust mites play a major role in allergic asthma and rhinitis. They occur in environments with high humidity wherever human skin is found. Dust mites absorb humidity from the atmosphere (ie, they do not drink) and feed on organic matter (including shed human and animal skin). Dust mites are a microscopic type of insect that you cannot see with the naked eye. High levels of dust mites have been detected from mattresses, pillows, carpets, upholstered furniture, bed covers, clothes, soft toys and any woven material. The principal allergen of the dust mite is found in its feces. A gram of dust may contain 1,000 mites and 250,000 fecal particles. Mite antigen is easily measured in the air during house cleaning activities. Dust mites do not bite and do not cause harm to humans, other than by triggering allergies/asthma.  Ways to decrease your exposure to dust mites in your home:  1. Encase mattresses, box springs and pillows with a mite-impermeable barrier or cover  2. Wash sheets, blankets and drapes weekly in hot water (130 F) with  detergent and dry them in a dryer on the hot setting.  3. Have the room cleaned frequently with a vacuum cleaner and a damp dust-mop. For carpeting or rugs, vacuuming with a vacuum cleaner equipped with a high-efficiency particulate air (HEPA) filter. The dust mite allergic individual should not be in a room which is being cleaned and should wait 1 hour after cleaning before going into the room.  4. Do not sleep on upholstered furniture (eg, couches).  5. If possible removing carpeting, upholstered furniture and drapery from the home is ideal. Horizontal blinds should be eliminated in the rooms where the person spends the most time (bedroom, study, television room). Washable vinyl, roller-type shades are optimal.  6. Remove all non-washable stuffed toys from the bedroom. Wash stuffed toys weekly like sheets and blankets above.  7. Reduce indoor humidity to less than 50%. Inexpensive humidity monitors can be purchased at most hardware stores. Do not use a humidifier as can make the problem worse and are not recommended.  Return in about 5 months (around 12/13/2023), or if symptoms worsen or fail to improve.    Thank you for the opportunity to care for this patient.  Please do not hesitate to contact me with questions.  Tinnie Forehand, FNP [facility] 

## 2023-07-23 NOTE — Patient Instructions (Addendum)
 Asthma/cough-with acute exacerbation Start prednisone  taking 2 tablets twice a day for 3 days, then on the fourth day take 2 tablets in the morning, and on the fifth day take 1 tablet and stop.  If your symptoms do not get better please go to the emergency room or urgent care. Continue Pulmicort  180- 2 puffs twice a day to prevent cough and wheeze.Rinse mouth out after.   Continue Singulair  10 mg once a day to help prevent cough and wheeze. May use albuterol  2 puffs every 4 hours as needed for cough, wheeze, tightness in chest or shortness of breath.   You may use albuterol  2 puffs 5 to 15 minutes prior to exercise  Asthma control goals:  Full participation in all desired activities (may need albuterol  before activity) Albuterol  use two time or less a week on average (not counting use with activity) Cough interfering with sleep two time or less a month Oral steroids no more than once a year No hospitalizations    Allergic rhinitis Continue allergen avoidance measures directed toward dust mite, cat, and pollen as listed below Continue Flonase  2 sprays each nostril once a day to help prevent stuffy nose.  In the right nostril, point the applicator out toward the right ear. In the left nostril, point the applicator out toward the left ear Consider saline nasal rinses as needed for nasal symptoms. Use this before any medicated nasal sprays for best result Continue Zyrtec  (cetirizine ) 10 mg once a day for a runny nose or itch.  For thick post nasal drainage, begin Mucinex [phone] mg twice a day and increase fluid intake as tolerated.  Consider updating your environmental allergy testing.  Remember to stop antihistamines for 3 days before your testing appointment.  After allergy testing, consider allergen immunotherapy if your symptoms are not well-controlled with the treatment plan as listed above  Reflux Continue dietary and lifestyle modifications as listed below Continue pantoprazole  40 mg  once a day as you have been   Atopic dermatitis Continue a daily moisturizing routine Continue hydrocortisone 1% to red itchy areas on your face and neck Continue triamcinolone 0.1% ointment to red itchy areas below your face or neck twice a day as needed. Do not use triamcinolone on face, neck, groin, or armpit region.   Urticaria Continue an antihistamine once a day as needed for itch.  He may take an additional antihistamine once a day as needed for breakthrough symptoms  Food allergy Avoid shellfish (shrimp, crab, lobster, oyster, scallops, clams, etc). In case of an allergic reaction, give Benadryl  50 mg  every 4 hours, and if life-threatening symptoms occur, inject with EpiPen  0.3 mg. Follow Emergency action plan   Call the clinic if this treatment plan is not working well for you  Keep already scheduled follow-up appointment on December 13, 2023 at 11 AM or sooner if needed.    Reducing Pollen Exposure The American Academy of Allergy, Asthma and Immunology suggests the following steps to reduce your exposure to pollen during allergy seasons. Do not hang sheets or clothing out to dry; pollen may collect on these items. Do not mow lawns or spend time around freshly cut grass; mowing stirs up pollen. Keep windows closed at night.  Keep car windows closed while driving. Minimize morning activities outdoors, a time when pollen counts are usually at their highest. Stay indoors as much as possible when pollen counts or humidity is high and on windy days when pollen tends to remain in the air longer. Use  air conditioning when possible.  Many air conditioners have filters that trap the pollen spores. Use a HEPA room air filter to remove pollen form the indoor air you breathe.  Control of Dog or Cat Allergen Avoidance is the best way to manage a dog or cat allergy. If you have a dog or cat and are allergic to dog or cats, consider removing the dog or cat from the home. If you have a dog  or cat but don't want to find it a new home, or if your family wants a pet even though someone in the household is allergic, here are some strategies that may help keep symptoms at bay:  Keep the pet out of your bedroom and restrict it to only a few rooms. Be advised that keeping the dog or cat in only one room will not limit the allergens to that room. Don't pet, hug or kiss the dog or cat; if you do, wash your hands with soap and water. High-efficiency particulate air (HEPA) cleaners run continuously in a bedroom or living room can reduce allergen levels over time. Regular use of a high-efficiency vacuum cleaner or a central vacuum can reduce allergen levels. Giving your dog or cat a bath at least once a week can reduce airborne allergen.   Control of Dust Mite Allergen Dust mites play a major role in allergic asthma and rhinitis. They occur in environments with high humidity wherever human skin is found. Dust mites absorb humidity from the atmosphere (ie, they do not drink) and feed on organic matter (including shed human and animal skin). Dust mites are a microscopic type of insect that you cannot see with the naked eye. High levels of dust mites have been detected from mattresses, pillows, carpets, upholstered furniture, bed covers, clothes, soft toys and any woven material. The principal allergen of the dust mite is found in its feces. A gram of dust may contain 1,000 mites and 250,000 fecal particles. Mite antigen is easily measured in the air during house cleaning activities. Dust mites do not bite and do not cause harm to humans, other than by triggering allergies/asthma.  Ways to decrease your exposure to dust mites in your home:  1. Encase mattresses, box springs and pillows with a mite-impermeable barrier or cover  2. Wash sheets, blankets and drapes weekly in hot water (130 F) with detergent and dry them in a dryer on the hot setting.  3. Have the room cleaned frequently with a vacuum  cleaner and a damp dust-mop. For carpeting or rugs, vacuuming with a vacuum cleaner equipped with a high-efficiency particulate air (HEPA) filter. The dust mite allergic individual should not be in a room which is being cleaned and should wait 1 hour after cleaning before going into the room.  4. Do not sleep on upholstered furniture (eg, couches).  5. If possible removing carpeting, upholstered furniture and drapery from the home is ideal. Horizontal blinds should be eliminated in the rooms where the person spends the most time (bedroom, study, television room). Washable vinyl, roller-type shades are optimal.  6. Remove all non-washable stuffed toys from the bedroom. Wash stuffed toys weekly like sheets and blankets above.  7. Reduce indoor humidity to less than 50%. Inexpensive humidity monitors can be purchased at most hardware stores. Do not use a humidifier as can make the problem worse and are not recommended.

## 2023-08-04 NOTE — Telephone Encounter (Signed)
 Patient called for Joyce Fox She completed the prednisone . She is taking the mucinex every twelve hours she bought a pack with 14pills and has 4 left. She is using her nbulizer every four hours as well as .her zyrtex 10ml in the am and singulair  at night. She states the cough is in her throat increased at night. Is there anything you can send in for her? Please advice. She uses the [facility] on file.

## 2023-08-04 NOTE — Telephone Encounter (Signed)
 Please ask her some questions:  Did the prednisone  help her cough last time? Does she have any drainage down her throat? Does albuterol  help with the cough?

## 2023-08-04 NOTE — Telephone Encounter (Signed)
 Prednisone  did improve cough a little bit. She said Dr. Hendricks Locker used to give her a 7 day course that would normally resolve it. When she lays down she coughs a lot and is having to sit up at night. Drainage is clear down her throat but having trouble getting it out. She tried mucinex but it did not help and albuterol  helps with the cough- she is using every 4 hours. The cough is mostly at night.

## 2023-08-12 ENCOUNTER — Encounter: Payer: Self-pay | Admitting: Family

## 2023-08-12 ENCOUNTER — Ambulatory Visit: Payer: Self-pay | Admitting: Family

## 2023-08-12 ENCOUNTER — Ambulatory Visit (INDEPENDENT_AMBULATORY_CARE_PROVIDER_SITE_OTHER): Admitting: Family

## 2023-08-12 ENCOUNTER — Ambulatory Visit (HOSPITAL_BASED_OUTPATIENT_CLINIC_OR_DEPARTMENT_OTHER)
Admission: RE | Admit: 2023-08-12 | Discharge: 2023-08-12 | Disposition: A | Discharge status: Home / Self Care | Source: Ambulatory Visit | Attending: Family | Admitting: Family

## 2023-08-12 VITALS — BP 118/68 | HR 89 | Temp 97.7°F | Resp 18

## 2023-08-12 DIAGNOSIS — K219 Gastro-esophageal reflux disease without esophagitis: Secondary | ICD-10-CM

## 2023-08-12 DIAGNOSIS — J4540 Moderate persistent asthma, uncomplicated: Secondary | ICD-10-CM | POA: Diagnosis not present

## 2023-08-12 DIAGNOSIS — R051 Acute cough: Secondary | ICD-10-CM | POA: Diagnosis present

## 2023-08-12 DIAGNOSIS — T7800XA Anaphylactic reaction due to unspecified food, initial encounter: Secondary | ICD-10-CM

## 2023-08-12 DIAGNOSIS — J302 Other seasonal allergic rhinitis: Secondary | ICD-10-CM

## 2023-08-12 DIAGNOSIS — T7800XD Anaphylactic reaction due to unspecified food, subsequent encounter: Secondary | ICD-10-CM

## 2023-08-12 DIAGNOSIS — R490 Dysphonia: Secondary | ICD-10-CM | POA: Diagnosis not present

## 2023-08-12 DIAGNOSIS — L501 Idiopathic urticaria: Secondary | ICD-10-CM

## 2023-08-12 DIAGNOSIS — J3089 Other allergic rhinitis: Secondary | ICD-10-CM

## 2023-08-12 MED ORDER — FAMOTIDINE 20 MG PO TABS: ORAL_TABLET | ORAL | 0 refills | Status: AC

## 2023-08-12 MED ORDER — LEVALBUTEROL TARTRATE 45 MCG/ACT IN AERO
4.0000 | INHALATION_SPRAY | Freq: Once | RESPIRATORY_TRACT | Status: AC
  Administered 2023-08-12: 4 via RESPIRATORY_TRACT

## 2023-08-12 MED ORDER — AZELASTINE HCL 0.1 % NA SOLN: NASAL | 2 refills | Status: AC

## 2023-08-12 NOTE — Patient Instructions (Addendum)
 Asthma/cough Discussed that I do not think her cough is due to her asthma due to therapy no change in her FEV1 after 4 puffs of Xopenex given Lets get a STAT chest x-ray due to your cough and shortness of breath. We will call you with results one they are back Continue Pulmicort  180- 2 puffs twice a day to prevent cough and wheeze.Rinse mouth out after.   Continue Singulair  10 mg once a day to help prevent cough and wheeze. May use albuterol  2 puffs every 4 hours as needed for cough, wheeze, tightness in chest or shortness of breath.   You may use albuterol  2 puffs 5 to 15 minutes prior to exercise  Asthma control goals:  Full participation in all desired activities (may need albuterol  before activity) Albuterol  use two time or less a week on average (not counting use with activity) Cough interfering with sleep two time or less a month Oral steroids no more than once a year No hospitalizations    Allergic rhinitis Continue allergen avoidance measures directed toward dust mite, cat, and pollen as listed below Continue Flonase  2 sprays each nostril once a day to help prevent stuffy nose.  In the right nostril, point the applicator out toward the right ear. In the left nostril, point the applicator out toward the left ear Consider saline nasal rinses as needed for nasal symptoms. Use this before any medicated nasal sprays for best result Continue Zyrtec  (cetirizine ) 10 mg once a day for a runny nose or itch.  For thick post nasal drainage, begin Mucinex [phone] mg twice a day and increase fluid intake as tolerated.  Consider updating your environmental allergy testing.  Remember to stop antihistamines for 3 days before your testing appointment.  After allergy testing, consider allergen immunotherapy if your symptoms are not well-controlled with the treatment plan as listed above Start Astelin (azelastine) nasal spray 1 to 2 sprays in each nostril once to twice a day as needed for runny  nose/drainage down throat Consider referral to ENT if hoarseness persists  Reflux Continue dietary and lifestyle modifications as listed below Continue pantoprazole  40 mg in the morning Lets do a 30-day trial of Pepcid  (famotidine ) 20 mg at night   Atopic dermatitis Continue a daily moisturizing routine Continue hydrocortisone 1% to red itchy areas on your face and neck Continue triamcinolone 0.1% ointment to red itchy areas below your face or neck twice a day as needed. Do not use triamcinolone on face, neck, groin, or armpit region.   Urticaria Continue an antihistamine once a day as needed for itch.  He may take an additional antihistamine once a day as needed for breakthrough symptoms  Food allergy Avoid shellfish (shrimp, crab, lobster, oyster, scallops, clams, etc). In case of an allergic reaction, give Benadryl  50 mg  every 4 hours, and if life-threatening symptoms occur, inject with EpiPen  0.3 mg. Follow Emergency action plan   Call the clinic if this treatment plan is not working well for you  Follow-up in 4 to 6 weeks or sooner if needed    Reducing Pollen Exposure The American Academy of Allergy, Asthma and Immunology suggests the following steps to reduce your exposure to pollen during allergy seasons. Do not hang sheets or clothing out to dry; pollen may collect on these items. Do not mow lawns or spend time around freshly cut grass; mowing stirs up pollen. Keep windows closed at night.  Keep car windows closed while driving. Minimize morning activities outdoors, a time when pollen  counts are usually at their highest. Stay indoors as much as possible when pollen counts or humidity is high and on windy days when pollen tends to remain in the air longer. Use air conditioning when possible.  Many air conditioners have filters that trap the pollen spores. Use a HEPA room air filter to remove pollen form the indoor air you breathe.  Control of Dog or Cat Allergen Avoidance  is the best way to manage a dog or cat allergy. If you have a dog or cat and are allergic to dog or cats, consider removing the dog or cat from the home. If you have a dog or cat but don't want to find it a new home, or if your family wants a pet even though someone in the household is allergic, here are some strategies that may help keep symptoms at bay:  Keep the pet out of your bedroom and restrict it to only a few rooms. Be advised that keeping the dog or cat in only one room will not limit the allergens to that room. Don't pet, hug or kiss the dog or cat; if you do, wash your hands with soap and water. High-efficiency particulate air (HEPA) cleaners run continuously in a bedroom or living room can reduce allergen levels over time. Regular use of a high-efficiency vacuum cleaner or a central vacuum can reduce allergen levels. Giving your dog or cat a bath at least once a week can reduce airborne allergen.   Control of Dust Mite Allergen Dust mites play a major role in allergic asthma and rhinitis. They occur in environments with high humidity wherever human skin is found. Dust mites absorb humidity from the atmosphere (ie, they do not drink) and feed on organic matter (including shed human and animal skin). Dust mites are a microscopic type of insect that you cannot see with the naked eye. High levels of dust mites have been detected from mattresses, pillows, carpets, upholstered furniture, bed covers, clothes, soft toys and any woven material. The principal allergen of the dust mite is found in its feces. A gram of dust may contain 1,000 mites and 250,000 fecal particles. Mite antigen is easily measured in the air during house cleaning activities. Dust mites do not bite and do not cause harm to humans, other than by triggering allergies/asthma.  Ways to decrease your exposure to dust mites in your home:  1. Encase mattresses, box springs and pillows with a mite-impermeable barrier or cover  2.  Wash sheets, blankets and drapes weekly in hot water (130 F) with detergent and dry them in a dryer on the hot setting.  3. Have the room cleaned frequently with a vacuum cleaner and a damp dust-mop. For carpeting or rugs, vacuuming with a vacuum cleaner equipped with a high-efficiency particulate air (HEPA) filter. The dust mite allergic individual should not be in a room which is being cleaned and should wait 1 hour after cleaning before going into the room.  4. Do not sleep on upholstered furniture (eg, couches).  5. If possible removing carpeting, upholstered furniture and drapery from the home is ideal. Horizontal blinds should be eliminated in the rooms where the person spends the most time (bedroom, study, television room). Washable vinyl, roller-type shades are optimal.  6. Remove all non-washable stuffed toys from the bedroom. Wash stuffed toys weekly like sheets and blankets above.  7. Reduce indoor humidity to less than 50%. Inexpensive humidity monitors can be purchased at most hardware stores. Do not use  a humidifier as can make the problem worse and are not recommended.

## 2023-08-12 NOTE — Telephone Encounter (Signed)
 Thanks

## 2023-08-12 NOTE — Progress Notes (Signed)
 [address] [place] [address] Dept: [phone]  FOLLOW UP NOTE  Patient ID: Halle Davlin, female    DOB: [birth year]  Age: 63 y.o. MRN: [id number] Date of Office Visit: 08/12/2023  Assessment  Chief Complaint: Cough  HPI Anadia Helmes is a very 63 year old female who presents today for acute visit of continued cough.  She was last seen by myself on Jul 23, 2023 for seasonal and perennial allergic rhinitis, moderate persistent asthma with acute exacerbation, hoarseness of voice, gastroesophageal reflux disease, allergy with anaphylaxis due to food, intrinsic atopic dermatitis, and idiopathic urticaria.  She denies any new diagnosis or surgeries since her last office visit.  Cough: She reports the cough got only a little bit better with the prednisone  she was prescribed at the last office visit.  The cough is worse at night.  At times the cough can be productive.  During the office visit today with me what she coughed up was clear in color.  She will hear wheezing when she coughs a lot she also hears a cracking in her throat when she coughs.  She is also short of breath when she coughs a lot.  The cough does wake her up at night.  She does not cough as much during the day and it is not that bad.  She has not made any trips to the emergency room or urgent care due to breathing problems.  She reports that albuterol  does help with the cough.  She mentions that she has been using her albuterol  every 4 hours, but then stated the last time she used her albuterol  was Wednesday around 12 AM or 1 AM.  She denies fever or chills.  When talking about her cough she points to her throat and reports that something feels like it is stuck there.  She continues to take Pulmicort  180 mcg 2 puffs twice a day and Singulair  10 mg once a day.  She is also having hoarseness due to the cough.  She mentions that when her asthma flares this is typically when she loses her voice.  Allergic rhinitis: She  reports rhinorrhea and nasal congestion when lying down.  She denies postnasal drip.  She uses Flonase  nasal spray as needed and takes Zyrtec  10 mg daily.  She has not been treated for any sinus infections since we last saw her.  Reflux: She does take pantoprazole  40 mg once a day and reports that she feels "something stuck in her throat.  Atopic dermatitis: She reports that the only breakouts she will have is on her face and it is not itchy.  Discussed that this does not sound like eczema and she reports that it is not eczema.  It all started after getting her Moderna shot.  She reports 3 to 4 days later after getting the Moderna shot she broke out on her face and on her back.  Urticaria: She reports that she does not have any hives.  She does take hydroxyzine daily.  Food allergy: She continues to avoid shellfish without any accidental ingestion or use of her epinephrine  autoinjector device.   Drug Allergies:  Allergies  Allergen Reactions   Acetaminophen  Hives   Aspirin Other (See Comments)    Per allergy test   Chocolate Flavoring Agent (Non-Screening) Other (See Comments)    Rash in mouth   Other Hives    Pain medication, pripoxyphone   Pineapple Other (See Comments)    Rash in mouth   Prunus  Persica Other (See Comments)    Rash in mouth   Sulfamethoxazole Hives   Tomato Other (See Comments)    Rash in mouth   Bee Venom Rash and Hives   Covid-19 Mrna Vacc (Moderna) Rash   Latex Rash and Hives   Okra Rash   Penicillins Rash    Review of Systems: Negative except as per HPI   Physical Exam: BP 118/68   Pulse 89   Temp 97.7 F (36.5 C) (Temporal)   Resp 18   SpO2 98%    Physical Exam Constitutional:      Appearance: Normal appearance.     Comments: Tearful at times when discussing her brothers health  HENT:     Head: Normocephalic and atraumatic.     Comments: Pharynx normal, eyes normal, ears: Unable to see bilateral tympanic membrane due to cerumen.  Nose:  Bilateral lower turbinates moderately edematous and pale with no drainage noted    Right Ear: Ear canal and external ear normal.     Left Ear: Ear canal and external ear normal.     Mouth/Throat:     Mouth: Mucous membranes are moist.     Pharynx: Oropharynx is clear.  Eyes:     Conjunctiva/sclera: Conjunctivae normal.  Cardiovascular:     Rate and Rhythm: Regular rhythm.     Heart sounds: Normal heart sounds.  Pulmonary:     Effort: Pulmonary effort is normal.     Breath sounds: Normal breath sounds.     Comments: Lungs clear to auscultation Musculoskeletal:     Cervical back: Neck supple.  Skin:    General: Skin is warm.  Neurological:     Mental Status: She is alert and oriented to person, place, and time.  Psychiatric:        Mood and Affect: Mood normal.        Behavior: Behavior normal.        Thought Content: Thought content normal.        Judgment: Judgment normal.     Diagnostics: FVC 1.67 L (57%), FEV1 1.48 L (64%), FEV1/FVC 0.89.  Spirometry indicates possible moderate restriction.  Spirometry is improved from last office visit.  4 puffs of Xopenex given.  Post bronchodilator response shows FVC 1.62 L, FEV1 1.48 L, FEV1/FVC 0.91.  Spirometry indicates possible moderate restriction.  There is no change in FEV1.  Assessment and Plan: 1. Acute cough   2. Hoarseness of voice   3. Moderate persistent asthma without complication   4. Gastroesophageal reflux disease, unspecified whether esophagitis present   5. Seasonal and perennial allergic rhinitis   6. Allergy with anaphylaxis due to food   7. Idiopathic urticaria     Meds ordered this encounter  Medications   levalbuterol (XOPENEX HFA) inhaler 4 puff    Patient Instructions  Asthma/cough Discussed that I do not think her cough is due to her asthma due to therapy no change in her FEV1 after 4 puffs of Xopenex given Lets get a STAT chest x-ray due to your cough and shortness of breath. We will call you with  results one they are back Continue Pulmicort  180- 2 puffs twice a day to prevent cough and wheeze.Rinse mouth out after.   Continue Singulair  10 mg once a day to help prevent cough and wheeze. May use albuterol  2 puffs every 4 hours as needed for cough, wheeze, tightness in chest or shortness of breath.   You may use albuterol  2 puffs 5 to 15 minutes  prior to exercise  Asthma control goals:  Full participation in all desired activities (may need albuterol  before activity) Albuterol  use two time or less a week on average (not counting use with activity) Cough interfering with sleep two time or less a month Oral steroids no more than once a year No hospitalizations    Allergic rhinitis Continue allergen avoidance measures directed toward dust mite, cat, and pollen as listed below Continue Flonase  2 sprays each nostril once a day to help prevent stuffy nose.  In the right nostril, point the applicator out toward the right ear. In the left nostril, point the applicator out toward the left ear Consider saline nasal rinses as needed for nasal symptoms. Use this before any medicated nasal sprays for best result Continue Zyrtec  (cetirizine ) 10 mg once a day for a runny nose or itch.  For thick post nasal drainage, begin Mucinex [phone] mg twice a day and increase fluid intake as tolerated.  Consider updating your environmental allergy testing.  Remember to stop antihistamines for 3 days before your testing appointment.  After allergy testing, consider allergen immunotherapy if your symptoms are not well-controlled with the treatment plan as listed above Start Astelin (azelastine) nasal spray 1 to 2 sprays in each nostril once to twice a day as needed for runny nose/drainage down throat Consider referral to ENT if hoarseness persists  Reflux Continue dietary and lifestyle modifications as listed below Continue pantoprazole  40 mg in the morning Lets do a 30-day trial of Pepcid  (famotidine ) 20 mg at  night   Atopic dermatitis Continue a daily moisturizing routine Continue hydrocortisone 1% to red itchy areas on your face and neck Continue triamcinolone 0.1% ointment to red itchy areas below your face or neck twice a day as needed. Do not use triamcinolone on face, neck, groin, or armpit region.   Urticaria Continue an antihistamine once a day as needed for itch.  He may take an additional antihistamine once a day as needed for breakthrough symptoms  Food allergy Avoid shellfish (shrimp, crab, lobster, oyster, scallops, clams, etc). In case of an allergic reaction, give Benadryl  50 mg  every 4 hours, and if life-threatening symptoms occur, inject with EpiPen  0.3 mg. Follow Emergency action plan   Call the clinic if this treatment plan is not working well for you  Follow-up in 4 to 6 weeks or sooner if needed    Reducing Pollen Exposure The American Academy of Allergy, Asthma and Immunology suggests the following steps to reduce your exposure to pollen during allergy seasons. Do not hang sheets or clothing out to dry; pollen may collect on these items. Do not mow lawns or spend time around freshly cut grass; mowing stirs up pollen. Keep windows closed at night.  Keep car windows closed while driving. Minimize morning activities outdoors, a time when pollen counts are usually at their highest. Stay indoors as much as possible when pollen counts or humidity is high and on windy days when pollen tends to remain in the air longer. Use air conditioning when possible.  Many air conditioners have filters that trap the pollen spores. Use a HEPA room air filter to remove pollen form the indoor air you breathe.  Control of Dog or Cat Allergen Avoidance is the best way to manage a dog or cat allergy. If you have a dog or cat and are allergic to dog or cats, consider removing the dog or cat from the home. If you have a dog or cat but don't want  to find it a new home, or if your family wants a  pet even though someone in the household is allergic, here are some strategies that may help keep symptoms at bay:  Keep the pet out of your bedroom and restrict it to only a few rooms. Be advised that keeping the dog or cat in only one room will not limit the allergens to that room. Don't pet, hug or kiss the dog or cat; if you do, wash your hands with soap and water. High-efficiency particulate air (HEPA) cleaners run continuously in a bedroom or living room can reduce allergen levels over time. Regular use of a high-efficiency vacuum cleaner or a central vacuum can reduce allergen levels. Giving your dog or cat a bath at least once a week can reduce airborne allergen.   Control of Dust Mite Allergen Dust mites play a major role in allergic asthma and rhinitis. They occur in environments with high humidity wherever human skin is found. Dust mites absorb humidity from the atmosphere (ie, they do not drink) and feed on organic matter (including shed human and animal skin). Dust mites are a microscopic type of insect that you cannot see with the naked eye. High levels of dust mites have been detected from mattresses, pillows, carpets, upholstered furniture, bed covers, clothes, soft toys and any woven material. The principal allergen of the dust mite is found in its feces. A gram of dust may contain 1,000 mites and 250,000 fecal particles. Mite antigen is easily measured in the air during house cleaning activities. Dust mites do not bite and do not cause harm to humans, other than by triggering allergies/asthma.  Ways to decrease your exposure to dust mites in your home:  1. Encase mattresses, box springs and pillows with a mite-impermeable barrier or cover  2. Wash sheets, blankets and drapes weekly in hot water (130 F) with detergent and dry them in a dryer on the hot setting.  3. Have the room cleaned frequently with a vacuum cleaner and a damp dust-mop. For carpeting or rugs, vacuuming with a  vacuum cleaner equipped with a high-efficiency particulate air (HEPA) filter. The dust mite allergic individual should not be in a room which is being cleaned and should wait 1 hour after cleaning before going into the room.  4. Do not sleep on upholstered furniture (eg, couches).  5. If possible removing carpeting, upholstered furniture and drapery from the home is ideal. Horizontal blinds should be eliminated in the rooms where the person spends the most time (bedroom, study, television room). Washable vinyl, roller-type shades are optimal.  6. Remove all non-washable stuffed toys from the bedroom. Wash stuffed toys weekly like sheets and blankets above.  7. Reduce indoor humidity to less than 50%. Inexpensive humidity monitors can be purchased at most hardware stores. Do not use a humidifier as can make the problem worse and are not recommended. Return in about 4 weeks (around 09/09/2023), or if symptoms worsen or fail to improve.    Thank you for the opportunity to care for this patient.  Please do not hesitate to contact me with questions.  Tinnie Forehand, FNP [facility] 

## 2023-08-12 NOTE — Progress Notes (Signed)
 Please let Joyce Fox know that her chest x-ray did not show anything that would cause her cough or shortness of breath

## 2023-09-09 NOTE — Patient Instructions (Incomplete)
 Asthma/cough Discussed that I do not think her cough is due to her asthma due to therapy no change in her FEV1 after 4 puffs of Xopenex  given Lets get a STAT chest x-ray due to your cough and shortness of breath. We will call you with results one they are back Continue Pulmicort  180- 2 puffs twice a day to prevent cough and wheeze.Rinse mouth out after.   Continue Singulair  10 mg once a day to help prevent cough and wheeze. May use albuterol  2 puffs every 4 hours as needed for cough, wheeze, tightness in chest or shortness of breath.   You may use albuterol  2 puffs 5 to 15 minutes prior to exercise  Asthma control goals:  Full participation in all desired activities (may need albuterol  before activity) Albuterol  use two time or less a week on average (not counting use with activity) Cough interfering with sleep two time or less a month Oral steroids no more than once a year No hospitalizations    Allergic rhinitis Continue allergen avoidance measures directed toward dust mite, cat, and pollen as listed below Continue Flonase  2 sprays each nostril once a day to help prevent stuffy nose.  In the right nostril, point the applicator out toward the right ear. In the left nostril, point the applicator out toward the left ear Consider saline nasal rinses as needed for nasal symptoms. Use this before any medicated nasal sprays for best result Continue Zyrtec  (cetirizine ) 10 mg once a day for a runny nose or itch.  For thick post nasal drainage, begin Mucinex [phone] mg twice a day and increase fluid intake as tolerated.  Consider updating your environmental allergy testing.  Remember to stop antihistamines for 3 days before your testing appointment.  After allergy testing, consider allergen immunotherapy if your symptoms are not well-controlled with the treatment plan as listed above Continue Astelin  (azelastine ) nasal spray 1 to 2 sprays in each nostril once to twice a day as needed for runny  nose/drainage down throat Consider referral to ENT if hoarseness persists  Reflux Continue dietary and lifestyle modifications as listed below Continue pantoprazole  40 mg in the morning Pepcid  (famotidine ) 20 mg at night   Atopic dermatitis Continue a daily moisturizing routine Continue hydrocortisone 1% to red itchy areas on your face and neck Continue triamcinolone 0.1% ointment to red itchy areas below your face or neck twice a day as needed. Do not use triamcinolone on face, neck, groin, or armpit region.   Urticaria Continue an antihistamine once a day as needed for itch.  He may take an additional antihistamine once a day as needed for breakthrough symptoms  Food allergy Avoid shellfish (shrimp, crab, lobster, oyster, scallops, clams, etc). In case of an allergic reaction, give Benadryl  50 mg  every 4 hours, and if life-threatening symptoms occur, inject with EpiPen  0.3 mg. Follow Emergency action plan   Call the clinic if this treatment plan is not working well for you  Follow-up in months or sooner if needed    Reducing Pollen Exposure The American [employer] All[employer] Ast[employer] and Immun[employer] suggests the following steps to reduce your exposure to pollen during allergy seasons. Do not hang sheets or clothing out to dry; pollen may collect on these items. Do not mow lawns or spend time around freshly cut grass; mowing stirs up pollen. Keep windows closed at night.  Keep car windows closed while driving. Minimize morning activities outdoors, a time when pollen counts are usually at their highest. Stay indoors as  much as possible when pollen counts or humidity is high and on windy days when pollen tends to remain in the air longer. Use air conditioning when possible.  Many air conditioners have filters that trap the pollen spores. Use a HEPA room air filter to remove pollen form the indoor air you breathe.  Control of Dog or Cat Allergen Avoidance is the best way to manage a dog  or cat allergy. If you have a dog or cat and are allergic to dog or cats, consider removing the dog or cat from the home. If you have a dog or cat but don't want to find it a new home, or if your family wants a pet even though someone in the household is allergic, here are some strategies that may help keep symptoms at bay:  Keep the pet out of your bedroom and restrict it to only a few rooms. Be advised that keeping the dog or cat in only one room will not limit the allergens to that room. Don't pet, hug or kiss the dog or cat; if you do, wash your hands with soap and water. High-efficiency particulate air (HEPA) cleaners run continuously in a bedroom or living room can reduce allergen levels over time. Regular use of a high-efficiency vacuum cleaner or a central vacuum can reduce allergen levels. Giving your dog or cat a bath at least once a week can reduce airborne allergen.   Control of Dust Mite Allergen Dust mites play a major role in allergic asthma and rhinitis. They occur in environments with high humidity wherever human skin is found. Dust mites absorb humidity from the atmosphere (ie, they do not drink) and feed on organic matter (including shed human and animal skin). Dust mites are a microscopic type of insect that you cannot see with the naked eye. High levels of dust mites have been detected from mattresses, pillows, carpets, upholstered furniture, bed covers, clothes, soft toys and any woven material. The principal allergen of the dust mite is found in its feces. A gram of dust may contain 1,000 mites and 250,000 fecal particles. Mite antigen is easily measured in the air during house cleaning activities. Dust mites do not bite and do not cause harm to humans, other than by triggering allergies/asthma.  Ways to decrease your exposure to dust mites in your home:  1. Encase mattresses, box springs and pillows with a mite-impermeable barrier or cover  2. Wash sheets, blankets and drapes  weekly in hot water (130 F) with detergent and dry them in a dryer on the hot setting.  3. Have the room cleaned frequently with a vacuum cleaner and a damp dust-mop. For carpeting or rugs, vacuuming with a vacuum cleaner equipped with a high-efficiency particulate air (HEPA) filter. The dust mite allergic individual should not be in a room which is being cleaned and should wait 1 hour after cleaning before going into the room.  4. Do not sleep on upholstered furniture (eg, couches).  5. If possible removing carpeting, upholstered furniture and drapery from the home is ideal. Horizontal blinds should be eliminated in the rooms where the person spends the most time (bedroom, study, television room). Washable vinyl, roller-type shades are optimal.  6. Remove all non-washable stuffed toys from the bedroom. Wash stuffed toys weekly like sheets and blankets above.  7. Reduce indoor humidity to less than 50%. Inexpensive humidity monitors can be purchased at most hardware stores. Do not use a humidifier as can make the problem worse and  are not recommended.

## 2023-09-10 ENCOUNTER — Ambulatory Visit: Admitting: Family

## 2023-10-11 ENCOUNTER — Other Ambulatory Visit: Payer: Self-pay | Admitting: Family

## 2023-10-22 ENCOUNTER — Other Ambulatory Visit: Payer: Self-pay | Admitting: Family

## 2023-10-27 ENCOUNTER — Other Ambulatory Visit: Payer: Self-pay | Admitting: Family

## 2023-11-23 ENCOUNTER — Other Ambulatory Visit: Payer: Self-pay | Admitting: Family

## 2023-12-13 ENCOUNTER — Ambulatory Visit: Admitting: Family

## 2023-12-15 NOTE — Patient Instructions (Incomplete)
 Asthma/cough Continue Pulmicort  180- 2 puffs twice a day to prevent cough and wheeze.Rinse mouth out after.   Continue Singulair  10 mg once a day to help prevent cough and wheeze. May use albuterol  2 puffs every 4 hours as needed for cough, wheeze, tightness in chest or shortness of breath.   You may use albuterol  2 puffs 5 to 15 minutes prior to exercise  Asthma control goals:  Full participation in all desired activities (may need albuterol  before activity) Albuterol  use two time or less a week on average (not counting use with activity) Cough interfering with sleep two time or less a month Oral steroids no more than once a year No hospitalizations    Allergic rhinitis Continue allergen avoidance measures directed toward dust mite, cat, and pollen as listed below Continue Flonase  2 sprays each nostril once a day to help prevent stuffy nose.  In the right nostril, point the applicator out toward the right ear. In the left nostril, point the applicator out toward the left ear Consider saline nasal rinses as needed for nasal symptoms. Use this before any medicated nasal sprays for best result Continue Zyrtec  (cetirizine ) 10 mg once a day for a runny nose or itch.  For thick post nasal drainage, begin Mucinex [phone] mg twice a day and increase fluid intake as tolerated.  Consider updating your environmental allergy testing.  Remember to stop antihistamines for 3 days before your testing appointment.  After allergy testing, consider allergen immunotherapy if your symptoms are not well-controlled with the treatment plan as listed above Continue Astelin  (azelastine ) nasal spray 1 to 2 sprays in each nostril once to twice a day as needed for runny nose/drainage down throat Consider referral to ENT if hoarseness persists  Reflux Continue dietary and lifestyle modifications as listed below Continue pantoprazole  40 mg in the morning Lets do a 30-day trial of Pepcid  (famotidine ) 20 mg at night    Atopic dermatitis Continue a daily moisturizing routine Continue hydrocortisone 1% to red itchy areas on your face and neck Continue triamcinolone 0.1% ointment to red itchy areas below your face or neck twice a day as needed. Do not use triamcinolone on face, neck, groin, or armpit region.   Urticaria Continue an antihistamine once a day as needed for itch.  He may take an additional antihistamine once a day as needed for breakthrough symptoms  Food allergy Avoid shellfish (shrimp, crab, lobster, oyster, scallops, clams, etc). In case of an allergic reaction, give Benadryl  50 mg  every 4 hours, and if life-threatening symptoms occur, inject with EpiPen  0.3 mg. Follow Emergency action plan   Call the clinic if this treatment plan is not working well for you  Follow-up in  months or sooner if needed    Reducing Pollen Exposure The American Academy of All[employer], Asthma and Immunology suggests the following steps to reduce your exposure to pollen during allergy seasons. Do not hang sheets or clothing out to dry; pollen may collect on these items. Do not mow lawns or spend time around freshly cut grass; mowing stirs up pollen. Keep windows closed at night.  Keep car windows closed while driving. Minimize morning activities outdoors, a time when pollen counts are usually at their highest. Stay indoors as much as possible when pollen counts or humidity is high and on windy days when pollen tends to remain in the air longer. Use air conditioning when possible.  Many air conditioners have filters that trap the pollen spores. Use a HEPA room air  filter to remove pollen form the indoor air you breathe.  Control of Dog or Cat Allergen Avoidance is the best way to manage a dog or cat allergy. If you have a dog or cat and are allergic to dog or cats, consider removing the dog or cat from the home. If you have a dog or cat but don't want to find it a new home, or if your family wants a pet even though  someone in the household is allergic, here are some strategies that may help keep symptoms at bay:  Keep the pet out of your bedroom and restrict it to only a few rooms. Be advised that keeping the dog or cat in only one room will not limit the allergens to that room. Don't pet, hug or kiss the dog or cat; if you do, wash your hands with soap and water. High-efficiency particulate air (HEPA) cleaners run continuously in a bedroom or living room can reduce allergen levels over time. Regular use of a high-efficiency vacuum cleaner or a central vacuum can reduce allergen levels. Giving your dog or cat a bath at least once a week can reduce airborne allergen.   Control of Dust Mite Allergen Dust mites play a major role in allergic asthma and rhinitis. They occur in environments with high humidity wherever human skin is found. Dust mites absorb humidity from the atmosphere (ie, they do not drink) and feed on organic matter (including shed human and animal skin). Dust mites are a microscopic type of insect that you cannot see with the naked eye. High levels of dust mites have been detected from mattresses, pillows, carpets, upholstered furniture, bed covers, clothes, soft toys and any woven material. The principal allergen of the dust mite is found in its feces. A gram of dust may contain 1,000 mites and 250,000 fecal particles. Mite antigen is easily measured in the air during house cleaning activities. Dust mites do not bite and do not cause harm to humans, other than by triggering allergies/asthma.  Ways to decrease your exposure to dust mites in your home:  1. Encase mattresses, box springs and pillows with a mite-impermeable barrier or cover  2. Wash sheets, blankets and drapes weekly in hot water (130 F) with detergent and dry them in a dryer on the hot setting.  3. Have the room cleaned frequently with a vacuum cleaner and a damp dust-mop. For carpeting or rugs, vacuuming with a vacuum cleaner  equipped with a high-efficiency particulate air (HEPA) filter. The dust mite allergic individual should not be in a room which is being cleaned and should wait 1 hour after cleaning before going into the room.  4. Do not sleep on upholstered furniture (eg, couches).  5. If possible removing carpeting, upholstered furniture and drapery from the home is ideal. Horizontal blinds should be eliminated in the rooms where the person spends the most time (bedroom, study, television room). Washable vinyl, roller-type shades are optimal.  6. Remove all non-washable stuffed toys from the bedroom. Wash stuffed toys weekly like sheets and blankets above.  7. Reduce indoor humidity to less than 50%. Inexpensive humidity monitors can be purchased at most hardware stores. Do not use a humidifier as can make the problem worse and are not recommended.

## 2023-12-16 ENCOUNTER — Ambulatory Visit: Admitting: Family
